# Patient Record
Sex: Male | Born: 1940 | Race: White | Hispanic: No | Marital: Married | Smoking: Never smoker
Health system: Southern US, Community
[De-identification: ages and names within clinical notes are randomized; demographics above are authoritative.]

## PROBLEM LIST (undated history)

## (undated) DIAGNOSIS — E785 Hyperlipidemia, unspecified: Secondary | ICD-10-CM

## (undated) DIAGNOSIS — I10 Essential (primary) hypertension: Secondary | ICD-10-CM

## (undated) DIAGNOSIS — I2510 Atherosclerotic heart disease of native coronary artery without angina pectoris: Secondary | ICD-10-CM

## (undated) DIAGNOSIS — E7800 Pure hypercholesterolemia, unspecified: Secondary | ICD-10-CM

## (undated) DIAGNOSIS — I8290 Acute embolism and thrombosis of unspecified vein: Secondary | ICD-10-CM

## (undated) DIAGNOSIS — K219 Gastro-esophageal reflux disease without esophagitis: Secondary | ICD-10-CM

## (undated) HISTORY — PX: APPENDECTOMY: SHX54

## (undated) HISTORY — PX: KNEE SURGERY: SHX244

## (undated) HISTORY — PX: FACIAL RECONSTRUCTION SURGERY: SHX631

## (undated) HISTORY — DX: Pure hypercholesterolemia, unspecified: E78.00

## (undated) HISTORY — PX: HERNIA REPAIR: SHX51

## (undated) HISTORY — DX: Acute embolism and thrombosis of unspecified vein: I82.90

## (undated) HISTORY — DX: Essential (primary) hypertension: I10

## (undated) HISTORY — PX: CHOLECYSTECTOMY: SHX55

## (undated) HISTORY — DX: Atherosclerotic heart disease of native coronary artery without angina pectoris: I25.10

## (undated) HISTORY — PX: TONSILLECTOMY: SUR1361

---

## 2014-12-04 ENCOUNTER — Emergency Department (HOSPITAL_COMMUNITY): Payer: Medicare Other

## 2014-12-04 ENCOUNTER — Encounter (HOSPITAL_COMMUNITY): Payer: Self-pay | Admitting: Emergency Medicine

## 2014-12-04 ENCOUNTER — Emergency Department (HOSPITAL_COMMUNITY)
Admission: EM | Admit: 2014-12-04 | Discharge: 2014-12-04 | Disposition: A | Discharge status: Home / Self Care | Payer: Medicare Other | Attending: Emergency Medicine | Admitting: Emergency Medicine

## 2014-12-04 DIAGNOSIS — G4489 Other headache syndrome: Secondary | ICD-10-CM | POA: Diagnosis not present

## 2014-12-04 DIAGNOSIS — M5080 Other cervical disc disorders, unspecified cervical region: Secondary | ICD-10-CM | POA: Insufficient documentation

## 2014-12-04 DIAGNOSIS — M47812 Spondylosis without myelopathy or radiculopathy, cervical region: Secondary | ICD-10-CM

## 2014-12-04 DIAGNOSIS — M542 Cervicalgia: Secondary | ICD-10-CM | POA: Diagnosis not present

## 2014-12-04 DIAGNOSIS — R51 Headache: Secondary | ICD-10-CM | POA: Diagnosis present

## 2014-12-04 DIAGNOSIS — Z8719 Personal history of other diseases of the digestive system: Secondary | ICD-10-CM | POA: Insufficient documentation

## 2014-12-04 DIAGNOSIS — Z8639 Personal history of other endocrine, nutritional and metabolic disease: Secondary | ICD-10-CM | POA: Insufficient documentation

## 2014-12-04 HISTORY — DX: Hyperlipidemia, unspecified: E78.5

## 2014-12-04 HISTORY — DX: Gastro-esophageal reflux disease without esophagitis: K21.9

## 2014-12-04 MED ORDER — OXYCODONE-ACETAMINOPHEN 5-325 MG PO TABS
1.0000 | ORAL_TABLET | Freq: Once | ORAL | Status: AC
  Administered 2014-12-04: 1 via ORAL
  Filled 2014-12-04: qty 1

## 2014-12-04 MED ORDER — PREDNISONE 20 MG PO TABS: 20.0000 mg | ORAL_TABLET | Freq: Two times a day (BID) | ORAL | Status: DC

## 2014-12-04 MED ORDER — OXYCODONE-ACETAMINOPHEN 5-325 MG PO TABS: 1.0000 | ORAL_TABLET | ORAL | Status: DC | PRN

## 2014-12-04 NOTE — ED Notes (Signed)
Pt st's feels better after pain med 

## 2014-12-04 NOTE — ED Provider Notes (Signed)
CSN: [id number]     Arrival date & time 12/04/14  1606 History   First MD Initiated Contact with Patient 12/04/14 1632     Chief Complaint  Patient presents with  . Headache     (Consider location/radiation/quality/duration/timing/severity/associated sxs/prior Treatment) Patient is a 74 y.o. male presenting with headaches. The history is provided by the patient.  Headache  Alex Day is a 73 y.o. male here for intermittent, left-sided headache, with left-sided neck pain, and tearing from the left eye.  No radiation of pain to arm, shoulder or back.  No fever, nausea, vomiting, weakness or dizziness.  His been having occasional wheezing over the last month.  This morning he was seen at a clinic and started on albuterol inhaler, Claritin, and low-dose aspirin, for his symptoms.  Apparently there his blood pressure was elevated to "170".  He has never had this before.  No history of high blood pressure, cardiac disease or diabetes. There are no other known modifying factors.  Past Medical History  Diagnosis Date  . Acid reflux   . Hyperlipidemia    Past Surgical History  Procedure Laterality Date  . Appendectomy    . Tonsillectomy    . Knee surgery    . Hernia repair    . Cholecystectomy    . Facial reconstruction surgery     No family history on file. History  Substance Use Topics  . Smoking status: Never Smoker   . Smokeless tobacco: Not on file  . Alcohol Use: No    Review of Systems  Neurological: Positive for headaches.  All other systems reviewed and are negative.     Allergies  Review of patient's allergies indicates no known allergies.  Home Medications   Prior to Admission medications   Not on File   BP 140/89 mmHg  Pulse 89  Temp(Src) 97.9 F (36.6 C) (Oral)  Resp 18  Ht 5\' 7"  (1.702 m)  Wt 185 lb (83.915 kg)  BMI 28.97 kg/m2  SpO2 97% Physical Exam  Constitutional: He is oriented to person, place, and time. He appears well-developed and  well-nourished.  HENT:  Head: Normocephalic and atraumatic.  Right Ear: External ear normal.  Left Ear: External ear normal.  Eyes: Conjunctivae and EOM are normal. Pupils are equal, round, and reactive to light.  Clear drainage, left eye.  Neck: Normal range of motion and phonation normal. Neck supple.  Cardiovascular: Normal rate, regular rhythm and normal heart sounds.   Pulmonary/Chest: Effort normal and breath sounds normal. He exhibits no bony tenderness.  Abdominal: Soft. There is no tenderness.  Musculoskeletal: Normal range of motion.  Decrease motion of the neck, with  lateral bending and lateral rotation secondary to pain.  Neurological: He is alert and oriented to person, place, and time. No cranial nerve deficit or sensory deficit. He exhibits normal muscle tone. Coordination normal.  Skin: Skin is warm, dry and intact.  Psychiatric: He has a normal mood and affect. His behavior is normal. Judgment and thought content normal.  Nursing note and vitals reviewed.   ED Course  Procedures (including critical care time)  Medications  oxyCODONE-acetaminophen (PERCOCET/ROXICET) 5-325 MG per tablet 1 tablet (not administered)    Patient Vitals for the past 24 hrs:  BP Temp Temp src Pulse Resp SpO2 Height Weight  12/04/14 1617 140/89 mmHg 97.9 F (36.6 C) Oral 89 18 97 % 5\' 7"  (1.702 m) 185 lb (83.915 kg)    6:06 PM Reevaluation with update and discussion. After initial  assessment and treatment, an updated evaluation reveals pain improved after Percocet.  No tearing from the left eye.  Findings discussed with patient and wife, all questions answered. [place] Review Labs Reviewed - No data to display  Imaging Review No results found.   EKG Interpretation None      MDM   Final diagnoses:  Other headache syndrome  Neck pain  Degenerative joint disease of cervical spine    Neck pain and headache likely secondary to degenerative joint disease of the  left upper neck.  Doubt CVA, spinal myelopathy or serious bacterial infection.  Nursing Notes Reviewed/ Care Coordinated Applicable Imaging Reviewed Interpretation of Laboratory Data incorporated into ED treatment  The patient appears reasonably screened and/or stabilized for discharge and I doubt any other medical condition or other [facility] requiring further screening, evaluation, or treatment in the ED at this time prior to discharge.  Plan: Home Medications- Prednisone and Percocet; Home Treatments- heat ; return here if the recommended treatment, does not improve the symptoms; Recommended follow up- PCP 1 week   Richarda Blade, MD 12/04/14 3893

## 2014-12-04 NOTE — ED Notes (Signed)
PT C/O headache without nausea or vomiting.  Pt alert and oriented x's 3. Neuro exam neg.  Family at bedside.

## 2014-12-04 NOTE — ED Notes (Signed)
Pt seen at [facility] this morning for intermittent headaches over past 2 days. He was told is headache got worse to come to ER. Pt also reports L sided neck stiffness, denies fevers/chills. Pt sts that his bp is elevated.

## 2014-12-04 NOTE — Discharge Instructions (Signed)
General Headache Without Cause A headache is pain or discomfort felt around the head or neck area. The specific cause of a headache may not be found. There are many causes and types of headaches. A few common ones are:  Tension headaches.  Migraine headaches.  Cluster headaches.  Chronic daily headaches. HOME CARE INSTRUCTIONS   Keep all follow-up appointments with your caregiver or any specialist referral.  Only take over-the-counter or prescription medicines for pain or discomfort as directed by your caregiver.  Lie down in a dark, quiet room when you have a headache.  Keep a headache journal to find out what may trigger your migraine headaches. For example, write down:  What you eat and drink.  How much sleep you get.  Any change to your diet or medicines.  Try massage or other relaxation techniques.  Put ice packs or heat on the head and neck. Use these 3 to 4 times per day for 15 to 20 minutes each time, or as needed.  Limit stress.  Sit up straight, and do not tense your muscles.  Quit smoking if you smoke.  Limit alcohol use.  Decrease the amount of caffeine you drink, or stop drinking caffeine.  Eat and sleep on a regular schedule.  Get 7 to 9 hours of sleep, or as recommended by your caregiver.  Keep lights dim if bright lights bother you and make your headaches worse. SEEK MEDICAL CARE IF:   You have problems with the medicines you were prescribed.  Your medicines are not working.  You have a change from the usual headache.  You have nausea or vomiting. SEEK IMMEDIATE MEDICAL CARE IF:   Your headache becomes severe.  You have a fever.  You have a stiff neck.  You have loss of vision.  You have muscular weakness or loss of muscle control.  You start losing your balance or have trouble walking.  You feel faint or pass out.  You have severe symptoms that are different from your first symptoms. MAKE SURE YOU:   Understand these  instructions.  Will watch your condition.  Will get help right away if you are not doing well or get worse. Document Released: 10/11/2005 Document Revised: 01/03/2012 Document Reviewed: 10/27/2011 [facility] Patient Information 2015 [place], [place]. This information is not intended to replace advice given to you by your health care provider. Make sure you discuss any questions you have with your health care provider. Osteoarthritis Osteoarthritis is a disease that causes soreness and inflammation of a joint. It occurs when the cartilage at the affected joint wears down. Cartilage acts as a cushion, covering the ends of bones where they meet to form a joint. Osteoarthritis is the most common form of arthritis. It often occurs in older people. The joints affected most often by this condition include those in the:  Ends of the fingers.  Thumbs.  Neck.  Lower back.  Knees.  Hips. CAUSES  Over time, the cartilage that covers the ends of bones begins to wear away. This causes bone to rub on bone, producing pain and stiffness in the affected joints.  RISK FACTORS Certain factors can increase your chances of having osteoarthritis, including:  Older age.  Excessive body weight.  Overuse of joints.  Previous joint injury. SIGNS AND SYMPTOMS   Pain, swelling, and stiffness in the joint.  Over time, the joint may lose its normal shape.  Small deposits of bone (osteophytes) may grow on the edges of the joint.  Bits of  bone or cartilage can break off and float inside the joint space. This may cause more pain and damage. DIAGNOSIS  Your health care provider will do a physical exam and ask about your symptoms. Various tests may be ordered, such as:  X-rays of the affected joint.  An MRI scan.  Blood tests to rule out other types of arthritis.  Joint fluid tests. This involves using a needle to draw fluid from the joint and examining the fluid under a microscope. TREATMENT  Goals of  treatment are to control pain and improve joint function. Treatment plans may include:  A prescribed exercise program that allows for rest and joint relief.  A weight control plan.  Pain relief techniques, such as:  Properly applied heat and cold.  Electric pulses delivered to nerve endings under the skin (transcutaneous electrical nerve stimulation [TENS]).  Massage.  Certain nutritional supplements.  Medicines to control pain, such as:  Acetaminophen.  Nonsteroidal anti-inflammatory drugs (NSAIDs), such as naproxen.  Narcotic or central-acting agents, such as tramadol.  Corticosteroids. These can be given orally or as an injection.  Surgery to reposition the bones and relieve pain (osteotomy) or to remove loose pieces of bone and cartilage. Joint replacement may be needed in advanced states of osteoarthritis. HOME CARE INSTRUCTIONS   Take medicines only as directed by your health care provider.  Maintain a healthy weight. Follow your health care provider's instructions for weight control. This may include dietary instructions.  Exercise as directed. Your health care provider can recommend specific types of exercise. These may include:  Strengthening exercises. These are done to strengthen the muscles that support joints affected by arthritis. They can be performed with weights or with exercise bands to add resistance.  Aerobic activities. These are exercises, such as brisk walking or low-impact aerobics, that get your heart pumping.  Range-of-motion activities. These keep your joints limber.  Balance and agility exercises. These help you maintain daily living skills.  Rest your affected joints as directed by your health care provider.  Keep all follow-up visits as directed by your health care provider. SEEK MEDICAL CARE IF:   Your skin turns red.  You develop a rash in addition to your joint pain.  You have worsening joint pain.  You have a fever along with  joint or muscle aches. SEEK IMMEDIATE MEDICAL CARE IF:  You have a significant loss of weight or appetite.  You have night sweats. [place] of Arthritis and Musculoskeletal and Skin Diseases: www.niams.SouthExposed.es  [employer] on Aging: http://kim-miller.com/  American College of Rheumatology: www.rheumatology.org Document Released: 10/11/2005 Document Revised: 02/25/2014 Document Reviewed: 06/18/2013 [facility] Patient Information 2015 [place], [place]. This information is not intended to replace advice given to you by your health care provider. Make sure you discuss any questions you have with your health care provider.

## 2015-08-30 IMAGING — CT CT CERVICAL SPINE W/O CM
3 of 5 series · 10 of 27 positions shown, 12 images · non-contrast
Comparison: None.

CLINICAL DATA: 74-year-old male with intermittent headaches and
left-sided neck stiffness over the past 2 days

EXAM:
CT HEAD WITHOUT CONTRAST
CT CERVICAL SPINE WITHOUT CONTRAST
TECHNIQUE: Multidetector CT imaging of the head and cervical spine was
performed following the standard protocol without intravenous
contrast. Multiplanar CT image reconstructions of the cervical spine
were also generated.

[Series 5: c_spine 2.0 i30s 3 · axial · 0.21mm/px · z∈[-225,-175]mm · 2 of 77 slices shown]
[im 26/77  bone]
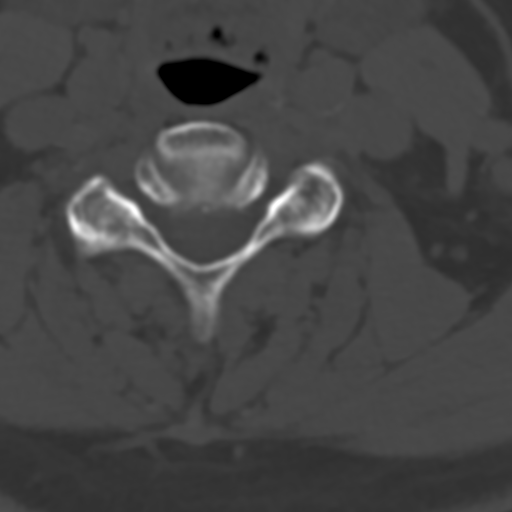
[im 51/77  bone]
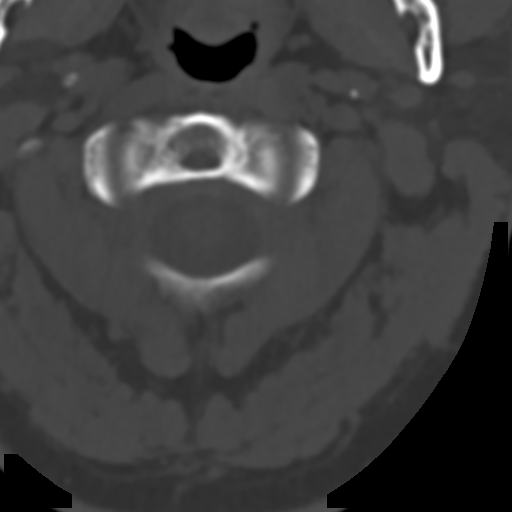

[Series 8: sagittals · sagittal · 0.14mm/px · 5 of 35 slices shown, 6 images]
[im 12/35  bone]
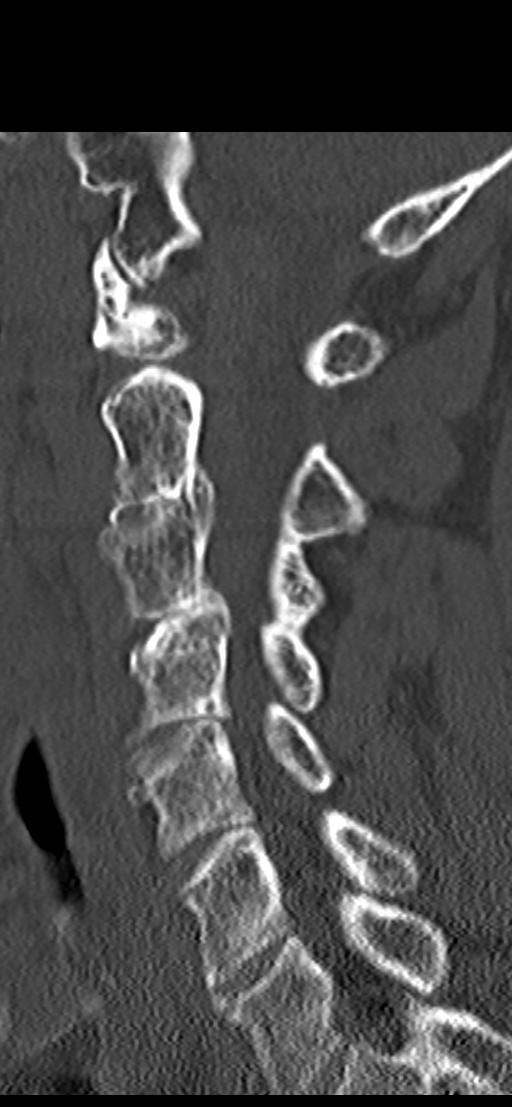
[im 15/35  bone]
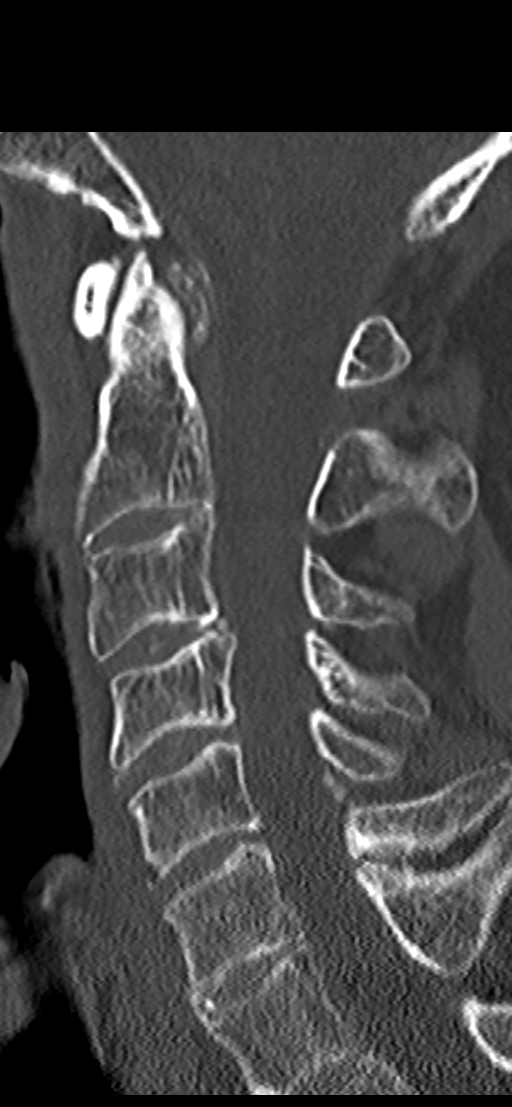
[im 18/35  soft-tissue]
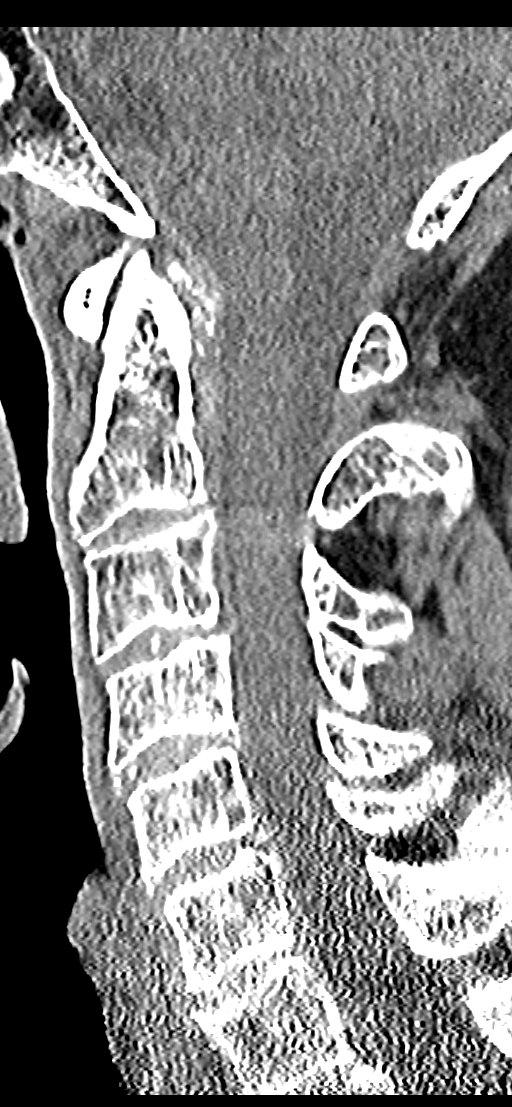
[im 18/35  bone]
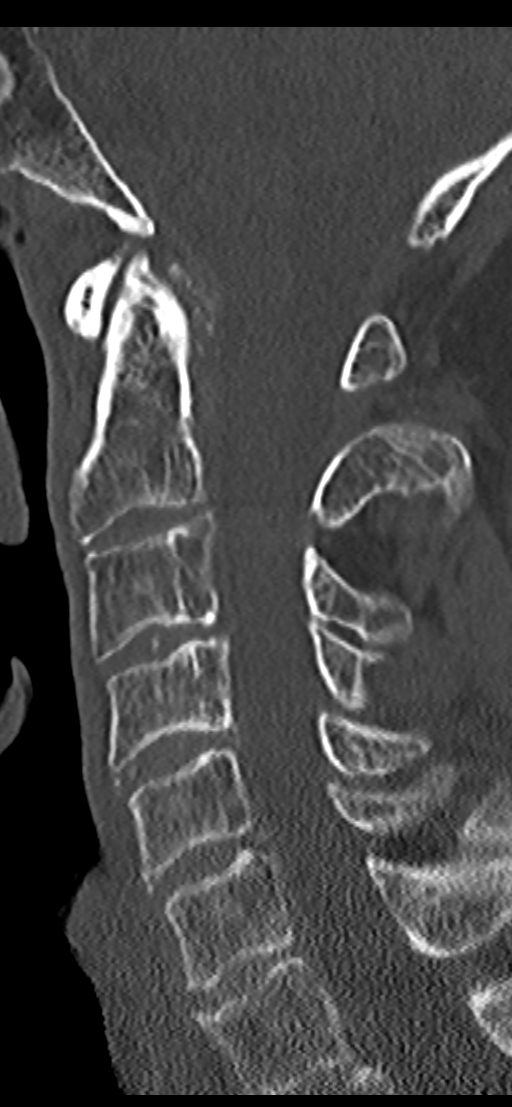
[im 20/35  bone]
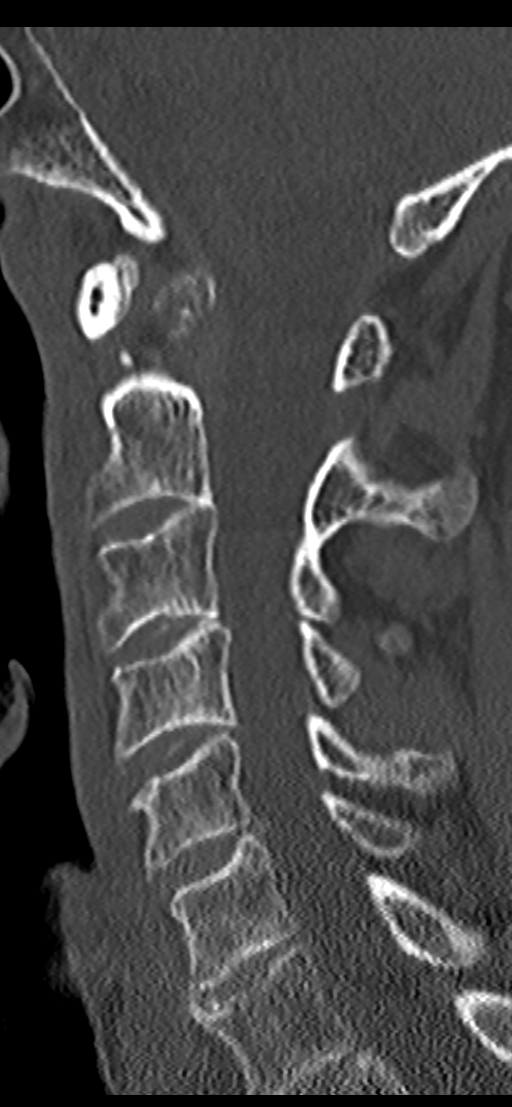
[im 23/35  bone]
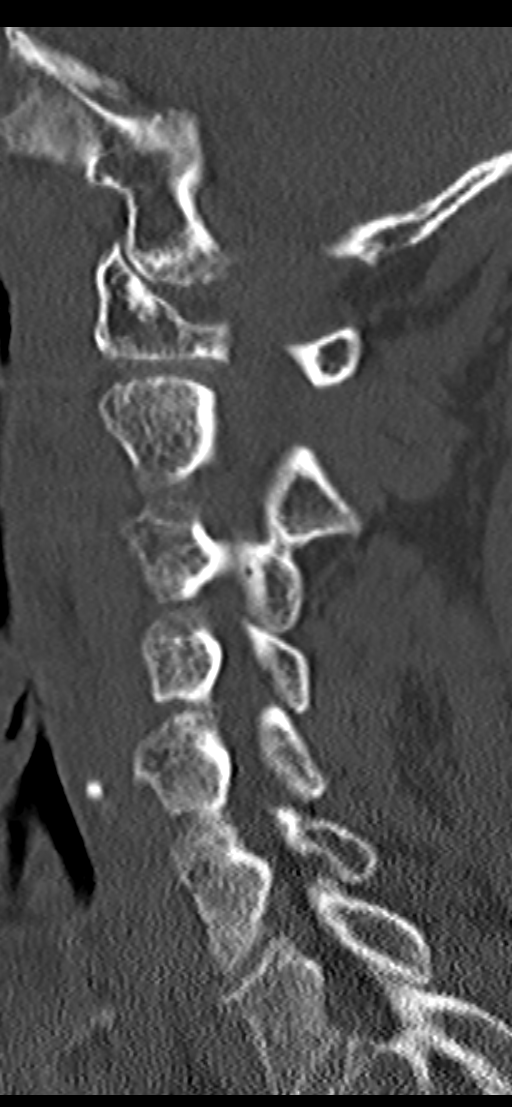

[Series 10: orthogonals · axial · 0.21mm/px · z∈[-260,-183]mm · 3 of 84 slices shown, 4 images]
[im 21/84  soft-tissue]
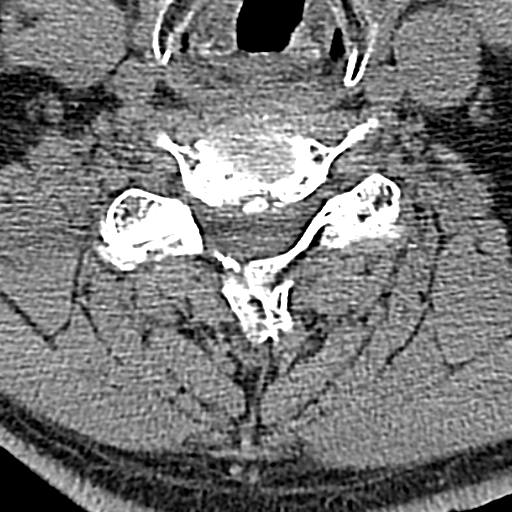
[im 21/84  bone]
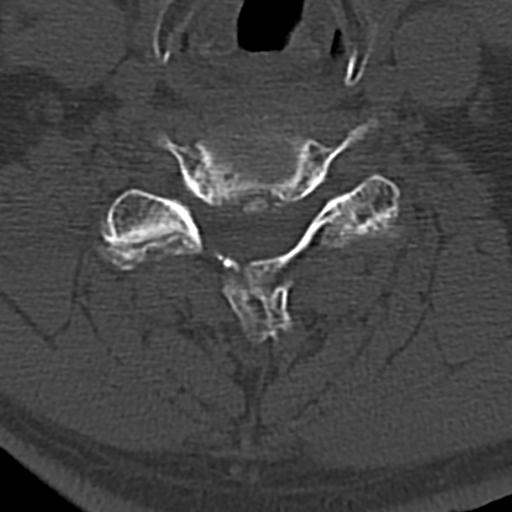
[im 42/84  bone]
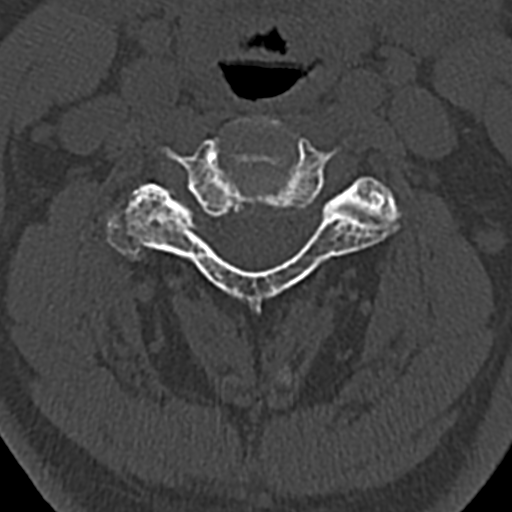
[im 63/84  bone]
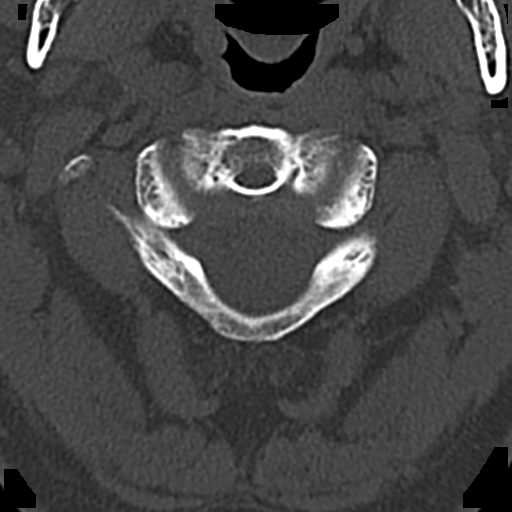

[10 of 27 positions shown; findings below may reference images not displayed]

FINDINGS: CT HEAD FINDINGS

Negative for acute intracranial hemorrhage, acute infarction, mass,
mass effect, hydrocephalus or midline shift. Gray-white
differentiation is preserved throughout. No focal soft tissue or
calvarial abnormality. Visualized globes and orbits are unremarkable
bilaterally. Surgical changes of prior ORIF of a left anterior
maxillary wall fracture. Normal aeration of the mastoid air cells.
Mucoperiosteal thickening of the right sphenoid air cell.

CT CERVICAL SPINE FINDINGS

No acute fracture, malalignment or prevertebral soft tissue
swelling. Degenerative changes about the atlantodental interval.
Ankylosis of the posterior elements of C2 and C3 on the left.
Ankylosis of posterior elements of C3 and C4 on the right.
Otherwise, relatively mild spondylitic change and degenerative disc
disease. Mild osteopenia. No lytic or blastic osseous lesion.
Incomplete fusion of the posterior elements at C6. Unremarkable CT
appearance of the thyroid gland. No acute soft tissue abnormality.
The lung apices are unremarkable.
IMPRESSION: CT HEAD

1. No acute intracranial abnormality.
2. Inflammatory paranasal sinus disease involving the right sphenoid
air cell.
3. Prior ORIF of a now healed fracture involving the left anterior
maxillary wall.
CT CSPINE

1. No acute fracture or malalignment.
2. Facet arthropathy with ankylosis of the posterior elements of C2
and C3 on the left and C3 and C4 on the right.
3. Incomplete fusion of the posterior elements at C6.

## 2017-12-14 DIAGNOSIS — I83812 Varicose veins of left lower extremities with pain: Secondary | ICD-10-CM | POA: Insufficient documentation

## 2018-01-25 DIAGNOSIS — M25472 Effusion, left ankle: Secondary | ICD-10-CM | POA: Insufficient documentation

## 2019-10-15 DIAGNOSIS — C4492 Squamous cell carcinoma of skin, unspecified: Secondary | ICD-10-CM

## 2019-10-15 HISTORY — DX: Squamous cell carcinoma of skin, unspecified: C44.92

## 2019-11-13 ENCOUNTER — Other Ambulatory Visit: Payer: Self-pay

## 2019-11-13 ENCOUNTER — Telehealth: Payer: Self-pay | Admitting: Internal Medicine

## 2019-11-13 ENCOUNTER — Encounter: Payer: Self-pay | Admitting: Internal Medicine

## 2019-11-13 ENCOUNTER — Ambulatory Visit (INDEPENDENT_AMBULATORY_CARE_PROVIDER_SITE_OTHER): Payer: Medicare Other | Admitting: Internal Medicine

## 2019-11-13 VITALS — BP 112/72 | HR 66 | Temp 98.3°F | Ht 67.0 in | Wt 193.0 lb

## 2019-11-13 DIAGNOSIS — I2699 Other pulmonary embolism without acute cor pulmonale: Secondary | ICD-10-CM | POA: Diagnosis not present

## 2019-11-13 NOTE — Progress Notes (Signed)
Synopsis: Pre op evaluation  Subjective:   PATIENT ID: Alex Day GENDER: male DOB: [birth year], MRN: [id number]  Chief Complaint  Patient presents with  . Consult    Surgical clearance eye surgery     HPI Here for pre-op clearance for vitrectomy and membrane peel with general anesthesia.  Has had multiple surgeries in past without issue with anesthesia or breathing.  Only new issue is a pulmonary embolism, probably unprovoked in December 2019 although he does have a lot of anatomic vein problems in LLE per chart review from [facility] (Dr. Signa Kell).  He has been on eliquis indefinitely since, he is not sure why.  MMRC 0 dyspnea.  Neg ROS below.  No issues with bleeding with eliquis.  Question for pulmonary is can he take a break from eliquis for the surgery.  ROS Positive Symptoms in bold:  Constitutional fevers, chills, weight loss, fatigue, anorexia, malaise  Eyes decreased vision, double vision, eye irritation  Ears, Nose, Mouth, Throat sore throat, trouble swallowing, sinus congestion  Cardiovascular chest pain, paroxysmal nocturnal dyspnea, lower ext edema, palpitations   Respiratory SOB, cough, DOE, hemoptysis, wheezing  Gastrointestinal nausea, vomiting, diarrhea  Genitourinary burning with urination, trouble urinating  Musculoskeletal joint aches, joint swelling, back pain  Integumentary  rashes, skin lesions  Neurological focal weakness, focal numbness, trouble speaking, headaches  Psychiatric depression, anxiety, confusion  Endocrine polyuria, polydipsia, cold intolerance, heat intolerance  Hematologic abnormal bruising, abnormal bleeding, unexplained nose bleeds  Allergic/Immunologic recurrent infections, hives, swollen lymph nodes    Past Medical History:  Diagnosis Date  . Acid reflux   . Hyperlipidemia      No family history on file.   Past Surgical History:  Procedure Laterality Date  . APPENDECTOMY    . CHOLECYSTECTOMY     . FACIAL RECONSTRUCTION SURGERY    . HERNIA REPAIR    . KNEE SURGERY    . TONSILLECTOMY      Social History   Socioeconomic History  . Marital status: Married    Spouse name: Not on file  . Number of children: Not on file  . Years of education: Not on file  . Highest education level: Not on file  Occupational History  . Not on file  Tobacco Use  . Smoking status: Never Smoker  . Smokeless tobacco: Never Used  Substance and Sexual Activity  . Alcohol use: No  . Drug use: No  . Sexual activity: Not Currently  Other Topics Concern  . Not on file  Social History Narrative  . Not on file   Social Determinants of Health   Financial Resource Strain:   . Difficulty of Paying Living Expenses: Not on file  Food Insecurity:   . Worried About Charity fundraiser in the Last Year: Not on file  . Ran Out of Food in the Last Year: Not on file  Transportation Needs:   . Lack of Transportation (Medical): Not on file  . Lack of Transportation (Non-Medical): Not on file  Physical Activity:   . Days of Exercise per Week: Not on file  . Minutes of Exercise per Session: Not on file  Stress:   . Feeling of Stress : Not on file  Social Connections:   . Frequency of Communication with Friends and Family: Not on file  . Frequency of Social Gatherings with Friends and Family: Not on file  . Attends Religious Services: Not on file  . Active Member of Clubs or Organizations: Not on file  .  Attends Archivist Meetings: Not on file  . Marital Status: Not on file  Intimate Partner Violence:   . Fear of Current or Ex-Partner: Not on file  . Emotionally Abused: Not on file  . Physically Abused: Not on file  . Sexually Abused: Not on file     No Known Allergies   Outpatient Medications Prior to Visit  Medication Sig Dispense Refill  . apixaban (ELIQUIS) 5 MG TABS tablet Take by mouth.    Marland Kitchen atorvastatin (LIPITOR) 40 MG tablet     . metoprolol succinate (TOPROL-XL) 50 MG 24 hr  tablet Take by mouth.    . Multiple Vitamin (MULTIVITAMIN) capsule Take by mouth.    Marland Kitchen omeprazole (PRILOSEC) 20 MG capsule Take by mouth.    . oxyCODONE-acetaminophen (PERCOCET) 5-325 MG per tablet Take 1 tablet by mouth every 4 (four) hours as needed for severe pain. 20 tablet 0  . predniSONE (DELTASONE) 20 MG tablet Take 1 tablet (20 mg total) by mouth 2 (two) times daily. 10 tablet 0  . albuterol (VENTOLIN HFA) 108 (90 Base) MCG/ACT inhaler      No facility-administered medications prior to visit.     Objective:  GEN: elderly man in NAD HEENT: MMM, no thrush, malampatti 1 CV: RRR, ext warm PULM: Clear, no accessory muscle use GI: Soft, +BS EXT: Maybe trace edema on left, arthritic changes in hands NEURO: moves all 4 ext to command PSYCH: AOx3, fair insight SKIN: No rashes, age-related changes  Vitals:   11/13/19 1455  BP: 112/72  Pulse: 66  Temp: 98.3 F (36.8 C)  TempSrc: Temporal  SpO2: 95%  Weight: 193 lb (87.5 kg)  Height: 5\' 7"  (1.702 m)   95% on RA BMI Readings from Last 3 Encounters:  11/13/19 30.23 kg/m  12/04/14 28.98 kg/m   Wt Readings from Last 3 Encounters:  11/13/19 193 lb (87.5 kg)  12/04/14 185 lb (83.9 kg)      Assessment & Plan:   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    Has had no issues since and I see no reason why he cannot go off eliquis for a week or so.  No further pulmonary testing needs to be done prior to planned surgery.  Will route to eye surgeon.  Please call if any questions, patient can f/u PRN.    Current Outpatient Medications:  .  apixaban (ELIQUIS) 5 MG TABS tablet, Take by mouth., Disp: , Rfl:  .  atorvastatin (LIPITOR) 40 MG tablet, , Disp: , Rfl:  .  metoprolol succinate (TOPROL-XL) 50 MG 24 hr tablet, Take by mouth., Disp: , Rfl:  .  Multiple Vitamin (MULTIVITAMIN) capsule, Take by mouth., Disp: , Rfl:  .  omeprazole (PRILOSEC) 20 MG capsule, Take by mouth., Disp: ,  Rfl:  .  oxyCODONE-acetaminophen (PERCOCET) 5-325 MG per tablet, Take 1 tablet by mouth every 4 (four) hours as needed for severe pain., Disp: 20 tablet, Rfl: 0 .  predniSONE (DELTASONE) 20 MG tablet, Take 1 tablet (20 mg total) by mouth 2 (two) times daily., Disp: 10 tablet, Rfl: 0 .  albuterol (VENTOLIN HFA) 108 (90 Base) MCG/ACT inhaler, , Disp: , Rfl:    Candee Furbish, MD [facility] 11/13/2019 4:27 PM

## 2019-11-13 NOTE — Telephone Encounter (Signed)
Office closed call 1/20

## 2019-11-13 NOTE — Patient Instructions (Signed)
-   You are okay to stop eliquis for your surgery - Ask your PCP how long they think you should remain on eliquis - No further pulmonary testing needs to be done prior to planned procedure

## 2019-11-15 NOTE — Telephone Encounter (Signed)
Spoke with Anderson [place] and she states she received the surgical clearance and pt is in surgery today. Nothing further is needed.

## 2020-10-07 ENCOUNTER — Encounter: Payer: Self-pay | Admitting: Cardiovascular Disease

## 2020-10-07 ENCOUNTER — Ambulatory Visit (INDEPENDENT_AMBULATORY_CARE_PROVIDER_SITE_OTHER): Payer: Medicare Other | Admitting: Cardiovascular Disease

## 2020-10-07 ENCOUNTER — Other Ambulatory Visit: Payer: Self-pay

## 2020-10-07 DIAGNOSIS — E785 Hyperlipidemia, unspecified: Secondary | ICD-10-CM | POA: Insufficient documentation

## 2020-10-07 DIAGNOSIS — I480 Paroxysmal atrial fibrillation: Secondary | ICD-10-CM

## 2020-10-07 DIAGNOSIS — E782 Mixed hyperlipidemia: Secondary | ICD-10-CM | POA: Diagnosis not present

## 2020-10-07 DIAGNOSIS — Z86711 Personal history of pulmonary embolism: Secondary | ICD-10-CM | POA: Insufficient documentation

## 2020-10-07 NOTE — Patient Instructions (Signed)

## 2020-10-07 NOTE — Assessment & Plan Note (Signed)
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 

## 2020-10-07 NOTE — Assessment & Plan Note (Signed)
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

## 2020-10-07 NOTE — Assessment & Plan Note (Signed)
History of hyperlipidemia on statin therapy recently checked by his cardiologist in [place].

## 2020-10-07 NOTE — Progress Notes (Signed)
10/07/2020 Alex Day   1940-12-20  [id number]  Primary Physician Patient, No Pcp Per Primary Cardiologist: Lorretta Harp MD Renae Gloss  HPI:  Alex Day is a 79 y.o. mildly overweight married Caucasian male father of 1 daughter, grandfather of 64 grandson recently relocated from [place] to [place] to be closer to his family.  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  He currently does not have a primary care physician.  He was taken care of by a cardiologist, Dr. Posey Pronto in the [place] who last saw him in the office 08/07/2020 [facility]).  He has never smoked and does not drink alcohol.  There is no family history of heart disease.  He is never had a heart attack or stroke.  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  There is also history of PAF.  He denies chest pain or shortness of breath.  He does not exercise but "putters around the house".  He had carotid and lower extremity Dopplers performed 2018 which were normal and a 2D echo that revealed normal LV systolic function without valvular abnormalities.   Current Meds  Medication Sig  . apixaban (ELIQUIS) 5 MG TABS tablet Take by mouth.  Marland Kitchen atorvastatin (LIPITOR) 40 MG tablet   . metoprolol succinate (TOPROL-XL) 50 MG 24 hr tablet Take by mouth.  . Multiple Vitamin (MULTIVITAMIN) capsule Take by mouth.  Marland Kitchen omeprazole (PRILOSEC) 20 MG capsule Take by mouth.     No Known Allergies  Social History   Socioeconomic History  . Marital status: Married    Spouse name: Not on file  . Number of children: Not on file  . Years of education: Not on file  . Highest education level: Not on file  Occupational History  . Not on file  Tobacco Use  . Smoking status: Never Smoker  . Smokeless tobacco: Never Used  Substance and Sexual Activity  . Alcohol use: No  . Drug use: No  .  Sexual activity: Not Currently  Other Topics Concern  . Not on file  Social History Narrative  . Not on file   Social Determinants of Health   Financial Resource Strain: Not on file  Food Insecurity: Not on file  Transportation Needs: Not on file  Physical Activity: Not on file  Stress: Not on file  Social Connections: Not on file  Intimate Partner Violence: Not on file     Review of Systems: General: negative for chills, fever, night sweats or weight changes.  Cardiovascular: negative for chest pain, dyspnea on exertion, edema, orthopnea, palpitations, paroxysmal nocturnal dyspnea or shortness of breath Dermatological: negative for rash Respiratory: negative for cough or wheezing Urologic: negative for hematuria Abdominal: negative for nausea, vomiting, diarrhea, bright red blood per rectum, melena, or hematemesis Neurologic: negative for visual changes, syncope, or dizziness All other systems reviewed and are otherwise negative except as noted above.    Blood pressure 112/78, pulse (!) 59, resp. rate 14, height 5\' 7"  (1.702 m), weight 188 lb 3.2 oz (85.4 kg), SpO2 96 %.  General appearance: alert and no distress Neck: no adenopathy, no carotid bruit, no JVD, supple, symmetrical, trachea midline and thyroid not enlarged, symmetric, no tenderness/mass/nodules Lungs: clear to auscultation bilaterally Heart: regular rate and rhythm, S1, S2 normal, no murmur, click, rub or gallop Extremities: extremities  normal, atraumatic, no cyanosis or edema Pulses: 2+ and symmetric Skin: Skin color, texture, turgor normal. No rashes or lesions Neurologic: Alert and oriented X 3, normal strength and tone. Normal symmetric reflexes. Normal coordination and gait  EKG sinus bradycardia 58 with right bundle branch block.  I personally reviewed this EKG.  ASSESSMENT AND PLAN:   Hyperlipidemia History of hyperlipidemia on statin therapy recently checked by his cardiologist in [place].  History of pulmonary embolism History of remote DVT and subsequent pulmonary embolism 2 to 3 years ago on Eliquis oral anticoagulation.  PAF (paroxysmal atrial fibrillation) (HCC) History of PAF on Eliquis oral anticoagulation.      Lorretta Harp MD FACP,FACC,FAHA, [facility] 10/07/2020 10:05 AM

## 2021-02-17 ENCOUNTER — Other Ambulatory Visit: Payer: Self-pay

## 2021-02-17 ENCOUNTER — Encounter: Payer: Self-pay | Admitting: Dermatology

## 2021-02-17 ENCOUNTER — Ambulatory Visit (INDEPENDENT_AMBULATORY_CARE_PROVIDER_SITE_OTHER): Payer: Medicare Other | Admitting: Dermatology

## 2021-02-17 DIAGNOSIS — Z1283 Encounter for screening for malignant neoplasm of skin: Secondary | ICD-10-CM | POA: Diagnosis not present

## 2021-02-17 DIAGNOSIS — L821 Other seborrheic keratosis: Secondary | ICD-10-CM | POA: Diagnosis not present

## 2021-02-17 DIAGNOSIS — L729 Follicular cyst of the skin and subcutaneous tissue, unspecified: Secondary | ICD-10-CM | POA: Diagnosis not present

## 2021-02-17 DIAGNOSIS — L570 Actinic keratosis: Secondary | ICD-10-CM | POA: Diagnosis not present

## 2021-02-17 DIAGNOSIS — D485 Neoplasm of uncertain behavior of skin: Secondary | ICD-10-CM | POA: Diagnosis not present

## 2021-02-17 NOTE — Patient Instructions (Signed)

## 2021-02-27 ENCOUNTER — Encounter: Payer: Self-pay | Admitting: Dermatology

## 2021-02-27 NOTE — Progress Notes (Signed)
   Follow-Up Visit   Subjective  Alex Day is a 80 y.o. male who presents for the following: Annual Exam (No new concerns).  New lesion right cheek, check several other spots Location:  Duration:  Quality:  Associated Signs/Symptoms: Modifying Factors:  Severity:  Timing: Context:   Objective  Well appearing patient in no apparent distress; mood and affect are within normal limits. Objective  Left Forehead: Tan flattopped textured 5 mm slightly raised papule; dermoscopy typical  Objective  Right Anterior Mandible: Pearly 5 mm pink papule with focal erosion     Objective  Dorsum of Nose, Left Zygomatic Area, Mid Forehead: Pink 3 to 6 mm gritty crusts    All skin waist up examined.   Assessment & Plan    Cyst of skin Mid Back  Seborrheic keratosis Left Forehead  Leave if stable  Neoplasm of uncertain behavior of skin Right Anterior Mandible  Skin / nail biopsy Type of biopsy: tangential   Informed consent: discussed and consent obtained   Timeout: patient name, date of birth, surgical site, and procedure verified   Procedure prep:  Patient was prepped and draped in usual sterile fashion (Non sterile) Prep type:  Chlorhexidine Anesthesia: the lesion was anesthetized in a standard fashion   Anesthetic:  1% lidocaine w/ epinephrine 1-100,000 local infiltration Instrument used: flexible razor blade   Hemostasis achieved with: ferric subsulfate   Outcome: patient tolerated procedure well   Post-procedure details: wound care instructions given    Specimen 1 - Surgical pathology Differential Diagnosis: R/O BCC vs SCC  Check Margins: No  AK (actinic keratosis) (3) Mid Forehead; Dorsum of Nose; Left Zygomatic Area  Destruction of lesion - Dorsum of Nose, Left Zygomatic Area, Mid Forehead Complexity: simple   Destruction method: cryotherapy   Informed consent: discussed and consent obtained   Timeout:  patient name, date of birth, surgical site, and  procedure verified Lesion destroyed using liquid nitrogen: Yes   Cryotherapy cycles:  3 Outcome: patient tolerated procedure well with no complications   Post-procedure details: wound care instructions given        I, Lavonna Monarch, MD, have reviewed all documentation for this visit.  The documentation on 02/27/21 for the exam, diagnosis, procedures, and orders are all accurate and complete.

## 2021-03-02 ENCOUNTER — Telehealth: Payer: Self-pay

## 2021-03-02 NOTE — Telephone Encounter (Signed)
-----   Message from Lavonna Monarch, MD sent at 02/24/2021  7:38 PM EDT ----- Although I was pleased that the biopsy did not show skin cancer, this may be the top of an underlying inflamed cyst.  If there is persistent inflammation or drainage I would have him return at which time we may inject the spot.

## 2021-03-02 NOTE — Telephone Encounter (Signed)
Phone call to patient with his pathology results. Patient aware.  

## 2021-04-09 ENCOUNTER — Encounter: Payer: Self-pay | Admitting: Podiatry

## 2021-04-09 ENCOUNTER — Ambulatory Visit (INDEPENDENT_AMBULATORY_CARE_PROVIDER_SITE_OTHER): Payer: Medicare Other | Admitting: Podiatry

## 2021-04-09 ENCOUNTER — Other Ambulatory Visit: Payer: Self-pay

## 2021-04-09 DIAGNOSIS — R202 Paresthesia of skin: Secondary | ICD-10-CM

## 2021-04-09 DIAGNOSIS — L600 Ingrowing nail: Secondary | ICD-10-CM

## 2021-04-09 DIAGNOSIS — Z87442 Personal history of urinary calculi: Secondary | ICD-10-CM | POA: Insufficient documentation

## 2021-04-09 DIAGNOSIS — R972 Elevated prostate specific antigen [PSA]: Secondary | ICD-10-CM | POA: Insufficient documentation

## 2021-04-09 DIAGNOSIS — N401 Enlarged prostate with lower urinary tract symptoms: Secondary | ICD-10-CM | POA: Insufficient documentation

## 2021-04-09 DIAGNOSIS — N529 Male erectile dysfunction, unspecified: Secondary | ICD-10-CM | POA: Insufficient documentation

## 2021-04-09 DIAGNOSIS — D6869 Other thrombophilia: Secondary | ICD-10-CM | POA: Insufficient documentation

## 2021-04-09 DIAGNOSIS — I700 Atherosclerosis of aorta: Secondary | ICD-10-CM | POA: Insufficient documentation

## 2021-04-09 DIAGNOSIS — M069 Rheumatoid arthritis, unspecified: Secondary | ICD-10-CM | POA: Insufficient documentation

## 2021-04-09 DIAGNOSIS — K219 Gastro-esophageal reflux disease without esophagitis: Secondary | ICD-10-CM | POA: Insufficient documentation

## 2021-04-09 DIAGNOSIS — N209 Urinary calculus, unspecified: Secondary | ICD-10-CM | POA: Insufficient documentation

## 2021-04-09 DIAGNOSIS — I10 Essential (primary) hypertension: Secondary | ICD-10-CM | POA: Insufficient documentation

## 2021-04-09 MED ORDER — NEOMYCIN-POLYMYXIN-HC 1 % OT SOLN: OTIC | 1 refills | Status: AC

## 2021-04-09 NOTE — Patient Instructions (Signed)

## 2021-04-11 NOTE — Progress Notes (Signed)
Subjective:  Patient ID: Alex Day, male    DOB: 1941/08/11,  MRN: [id number] HPI Chief Complaint  Patient presents with   Toe Pain    Hallux left - lateral border, ingrown x months, wife usually trims it out, but was too sensitive, better today though   New Patient (Initial Visit)    80 y.o. male presents with the above complaint.   ROS: Denies fever chills nausea vomiting muscle aches pains calf pain back pain chest pain shortness of breath.  Does relate hypersensitivity tingling in the legs and feet.  Past Medical History:  Diagnosis Date   Acid reflux    Hyperlipidemia    Past Surgical History:  Procedure Laterality Date   APPENDECTOMY     CHOLECYSTECTOMY     FACIAL RECONSTRUCTION SURGERY     HERNIA REPAIR     KNEE SURGERY     TONSILLECTOMY      Current Outpatient Medications:    NEOMYCIN-POLYMYXIN-HYDROCORTISONE (CORTISPORIN) 1 % SOLN OTIC solution, Apply 1-2 drops to toe BID after soaking, Disp: 10 mL, Rfl: 1   apixaban (ELIQUIS) 5 MG TABS tablet, Take by mouth., Disp: , Rfl:    atorvastatin (LIPITOR) 40 MG tablet, , Disp: , Rfl:    calcium-vitamin D (OSCAL WITH D) 500-200 MG-UNIT tablet, Take 1 tablet by mouth., Disp: , Rfl:    cycloSPORINE (RESTASIS) 0.05 % ophthalmic emulsion, 1 drop 2 (two) times daily., Disp: , Rfl:    folic acid (FOLVITE) 1 MG tablet, Take 1 mg by mouth daily., Disp: , Rfl:    magnesium 30 MG tablet, Take 30 mg by mouth 2 (two) times daily., Disp: , Rfl:    methotrexate (RHEUMATREX) 2.5 MG tablet, Take 2.5 mg by mouth once a week. Caution:Chemotherapy. Protect from light. Take 6 po q week, Disp: , Rfl:    metoprolol succinate (TOPROL-XL) 50 MG 24 hr tablet, Take by mouth., Disp: , Rfl:    Multiple Vitamin (MULTIVITAMIN) capsule, Take by mouth., Disp: , Rfl:    Omega-3 Fatty Acids (FISH OIL) 1000 MG CAPS, Take by mouth., Disp: , Rfl:    omeprazole (PRILOSEC) 20 MG capsule, Take by mouth., Disp: , Rfl:    vitamin B-12 (CYANOCOBALAMIN) 1000 MCG  tablet, Take 1,000 mcg by mouth daily., Disp: , Rfl:   Allergies  Allergen Reactions   No Known Allergies    Review of Systems Objective:  There were no vitals filed for this visit.  General: Well developed, nourished, in no acute distress, alert and oriented x3   Dermatological: Skin is warm, dry and supple bilateral. Nails x 10 are well maintained; remaining integument appears unremarkable at this time. There are no open sores, no preulcerative lesions, no rash or signs of infection present.  Sharp innervated nail margin along the fibular border of the hallux left.  Mild erythema no purulence no drainage.  Vascular: Dorsalis Pedis artery and Posterior Tibial artery pedal pulses are 2/4 bilateral with immedate capillary fill time. Pedal hair growth present. No varicosities and no lower extremity edema present bilateral.   Neruologic: Grossly intact via light touch bilateral. Vibratory intact via tuning fork bilateral. Protective threshold with Semmes Wienstein monofilament intact to all pedal sites bilateral. Patellar and Achilles deep tendon reflexes 2+ bilateral. No Babinski or clonus noted bilateral.  Skin is hyper sensate with allodynia type pain dorsal aspect of the foot left seems worse than the right  Musculoskeletal: No gross boney pedal deformities bilateral. No pain, crepitus, or limitation noted with foot and  ankle range of motion bilateral. Muscular strength 5/5 in all groups tested bilateral.  Gait: Unassisted, Nonantalgic.    Radiographs:  None taken  Assessment & Plan:   Assessment: Ingrown nail fibular border hallux left.  Query neuropathy or paresthesias from radicular origin.  Plan: Discussed etiology pathology conservative surgical therapies at this point we are referring him to neurology for lower extremity work-up.  Chemical matricectomy was performed to the fibular border today after local anesthetic was administered.  He tolerated procedure well without  complications.  He was provided with both oral and written home-going instructions for care and soaking of the toe as well as a prescription for Corticosporin otic to be applied twice daily after soaking.  I will follow-up with Eddie in 2 weeks.     Keaja Reaume T. [place], [place]

## 2021-04-13 ENCOUNTER — Encounter: Payer: Self-pay | Admitting: Neurology

## 2021-04-23 ENCOUNTER — Ambulatory Visit (INDEPENDENT_AMBULATORY_CARE_PROVIDER_SITE_OTHER): Payer: Medicare Other | Admitting: Podiatry

## 2021-04-23 ENCOUNTER — Other Ambulatory Visit: Payer: Self-pay

## 2021-04-23 DIAGNOSIS — L600 Ingrowing nail: Secondary | ICD-10-CM

## 2021-04-23 DIAGNOSIS — Z9889 Other specified postprocedural states: Secondary | ICD-10-CM

## 2021-04-23 NOTE — Progress Notes (Signed)
He presents today for follow-up of his matrixectomy left toe.  He states that is been doing just fine a little bit tender in the corner as he points to the redness.  States that he is continue to soak but was developing a rash from the Betadine.  Objective: Vital signs stable he is alert oriented x3 there is mild erythema to the proximal nail fold fibular border.  No purulence no malodor no signs of infection.  Assessment: Well-healing surgical toe.  Plan: I encouraged him to continue soak Epson salt warm water until the redness is gone away.  Should this worsen in any way he is to notify us immediately.

## 2021-05-11 ENCOUNTER — Encounter: Payer: Self-pay | Admitting: Neurology

## 2021-05-18 ENCOUNTER — Ambulatory Visit (INDEPENDENT_AMBULATORY_CARE_PROVIDER_SITE_OTHER): Payer: Medicare Other | Admitting: Neurology

## 2021-05-18 ENCOUNTER — Other Ambulatory Visit: Payer: Self-pay

## 2021-05-18 ENCOUNTER — Encounter: Payer: Self-pay | Admitting: Neurology

## 2021-05-18 VITALS — BP 114/76 | HR 71 | Ht 67.0 in | Wt 192.0 lb

## 2021-05-18 DIAGNOSIS — R202 Paresthesia of skin: Secondary | ICD-10-CM | POA: Diagnosis not present

## 2021-05-18 NOTE — Progress Notes (Signed)
[facility] Clinic Note - Initial Visit   Date: 05/18/21  [name] MRN: [id number] DOB: [birth year]   Dear Dr. Milinda Pointer:  Thank you for your kind referral of Alex Day for consultation of left foot paresthesias. Although his history is well known to you, please allow Alex Day to reiterate it for the purpose of our medical record. The patient was accompanied to the clinic by self.   History of Present Illness: Alex Day is a 80 y.o. left-handed male with PAF, hyperlipidemia, hypertension, d CAD presenting for evaluation of left foot numbness. For at least past 10 years, he has noticed abnormal sensation around the left ankle.  He has a hard time describing it, but says that there is a low level of numbness and tingling.  Symptoms are constant.  There are no exacerbating or alleviating factors such as rest or exercise.  He denies weakness or imbalance.  No back or radicular leg pain. For the past 2-3 weeks, he noticed intermittent shooting pain over a localized around at the base of the right 5th toe, it lasts 3 days and resolved. He recalls having NCS/EMG of the leg when he was living in [place].  He does not have the reports, but does not recall anything markedly abnormal that came from it and recalls it to be very painful.   He lives with is wife in a one-level home. 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   Past Medical History:  Diagnosis Date   Acid reflux    Blood clot in vein    Coronary artery disease    Hypercholesteremia    Hyperlipidemia    Hypertension     Past Surgical History:  Procedure Laterality Date   APPENDECTOMY     CHOLECYSTECTOMY     FACIAL RECONSTRUCTION SURGERY     HERNIA REPAIR     KNEE SURGERY     TONSILLECTOMY       Medications:  Outpatient Encounter Medications as of 05/18/2021  Medication Sig   apixaban (ELIQUIS) 5 MG TABS tablet Take by mouth.   atorvastatin  (LIPITOR) 40 MG tablet    calcium-vitamin D (OSCAL WITH D) 500-200 MG-UNIT tablet Take 1 tablet by mouth.   cycloSPORINE (RESTASIS) 0.05 % ophthalmic emulsion 1 drop 2 (two) times daily.   folic acid (FOLVITE) 1 MG tablet Take 1 mg by mouth daily.   magnesium 30 MG tablet Take 30 mg by mouth 2 (two) times daily.   methotrexate (RHEUMATREX) 2.5 MG tablet Take 2.5 mg by mouth once a week. Caution:Chemotherapy. Protect from light. Take 8 po q week   metoprolol succinate (TOPROL-XL) 50 MG 24 hr tablet Take by mouth.   Multiple Vitamin (MULTIVITAMIN) capsule Take by mouth.   NEOMYCIN-POLYMYXIN-HYDROCORTISONE (CORTISPORIN) 1 % SOLN OTIC solution Apply 1-2 drops to toe BID after soaking   Omega-3 Fatty Acids (FISH OIL) 1000 MG CAPS Take by mouth.   omeprazole (PRILOSEC) 20 MG capsule Take by mouth.   vitamin B-12 (CYANOCOBALAMIN) 1000 MCG tablet Take 1,000 mcg by mouth daily.   No facility-administered encounter medications on file as of 05/18/2021.    Allergies:  Allergies  Allergen Reactions   No Known Allergies     Family History: Family History  Problem Relation Age of Onset   Cancer Mother    Cancer Father     Social History: Social History   Tobacco Use   Smoking status: Never   Smokeless tobacco:  Never  Substance Use Topics   Alcohol use: No   Drug use: No   Social History   Social History Narrative   Not on file    Vital Signs:  BP 114/76   Pulse 71   Ht '5\' 7"'$  (1.702 m)   Wt 192 lb (87.1 kg)   SpO2 95%   BMI 30.07 kg/m    Neurological Exam: MENTAL STATUS including orientation to time, place, person, recent and remote memory, attention span and concentration, language, and fund of knowledge is normal.  Speech is not dysarthric.  CRANIAL NERVES: II:  No visual field defects.    III-IV-VI: Pupils equal round and reactive to light.  Normal conjugate, extra-ocular eye movements in all directions of gaze.  No nystagmus.  No ptosis.   V:  Normal facial sensation.     VII:  Normal facial symmetry and movements.   VIII:  Normal hearing and vestibular function.   IX-X:  Normal palatal movement.   XI:  Normal shoulder shrug and head rotation.   XII:  Normal tongue strength and range of motion, no deviation or fasciculation.  MOTOR:  No atrophy, fasciculations or abnormal movements.  No pronator drift.   Upper Extremity:  Right  Left  Deltoid  5/5   5/5   Biceps  5/5   5/5   Triceps  5/5   5/5   Infraspinatus 5/5  5/5  Medial pectoralis 5/5  5/5  Wrist extensors  5/5   5/5   Wrist flexors  5/5   5/5   Finger extensors  5/5   5/5   Finger flexors  5/5   5/5   Dorsal interossei  5/5   5/5   Abductor pollicis  5/5   5/5   Tone (Ashworth scale)  0  0   Lower Extremity:  Right  Left  Hip flexors  5/5   5/5   Hip extensors  5/5   5/5   Adductor 5/5  5/5  Abductor 5/5  5/5  Knee flexors  5/5   5/5   Knee extensors  5/5   5/5   Dorsiflexors  5/5   5/5   Plantarflexors  5/5   5/5   Toe extensors  5/5   5/5   Toe flexors  5/5   5/5   Tone (Ashworth scale)  0  0   MSRs:  Right        Left                  brachioradialis 2+  2+  biceps 2+  2+  triceps 2+  2+  patellar 2+  2+  ankle jerk 0  0  Hoffman no  no  plantar response down  down   SENSORY:  Reduced vibration at the ankles, absent at the toes.  Temperature and pin prick over the dorsum of the feet.   Romberg's sign absent.   COORDINATION/GAIT: Normal finger-to- nose-finger.  Intact rapid alternating movements bilaterally.  Able to rise from a chair without using arms.  Gait narrow based and stable. Tandem and stressed gait intact.    IMPRESSION: Bilateral feet paresthesias, worse on the left.  Although his is more aware of paresthesias in the left foot, his exam shows reduced sensation in both feet.  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  I recommend NCS/EMG of the legs to evaluate his symptoms, however, he recalls this being painful and prefers to hold on additional testing unless  symptoms get  worse.  At this time, he feels that it is more of an annoyance than painful or limiting his daily activities.     Return to clinic in 1 year  Thank you for allowing me to participate in patient's care.  If I can answer any additional questions, I would be pleased to do so.    Sincerely,    Diamantina Edinger K. Posey Pronto, DO

## 2021-05-18 NOTE — Patient Instructions (Addendum)
If your symptoms get worse, please contact my office.    Return to clinic in 1 year

## 2021-06-10 ENCOUNTER — Encounter: Payer: Self-pay | Admitting: Dermatology

## 2021-06-10 ENCOUNTER — Other Ambulatory Visit: Payer: Self-pay

## 2021-06-10 ENCOUNTER — Ambulatory Visit (INDEPENDENT_AMBULATORY_CARE_PROVIDER_SITE_OTHER): Payer: Medicare Other | Admitting: Dermatology

## 2021-06-10 DIAGNOSIS — C4441 Basal cell carcinoma of skin of scalp and neck: Secondary | ICD-10-CM

## 2021-06-10 DIAGNOSIS — L820 Inflamed seborrheic keratosis: Secondary | ICD-10-CM | POA: Diagnosis not present

## 2021-06-10 DIAGNOSIS — Z1283 Encounter for screening for malignant neoplasm of skin: Secondary | ICD-10-CM | POA: Diagnosis not present

## 2021-06-10 DIAGNOSIS — D485 Neoplasm of uncertain behavior of skin: Secondary | ICD-10-CM

## 2021-06-10 DIAGNOSIS — L729 Follicular cyst of the skin and subcutaneous tissue, unspecified: Secondary | ICD-10-CM | POA: Diagnosis not present

## 2021-06-10 MED ORDER — TRIAMCINOLONE ACETONIDE 10 MG/ML IJ SUSP: 10.0000 mg | Freq: Once | INTRAMUSCULAR | Status: AC

## 2021-06-10 NOTE — Patient Instructions (Signed)

## 2021-06-12 ENCOUNTER — Ambulatory Visit: Payer: Medicare Other | Admitting: Neurology

## 2021-06-17 ENCOUNTER — Telehealth: Payer: Self-pay | Admitting: *Deleted

## 2021-06-17 NOTE — Telephone Encounter (Signed)
-----   Message from Lavonna Monarch, MD sent at 06/17/2021  5:21 AM EDT ----- Previous biopsy showed inflammation, and this 1 showed a nonmelanoma skin cancer.  Schedule surgery with Dr. Darene Lamer but there is some chance I may recommend Mohs surgery.

## 2021-06-17 NOTE — Telephone Encounter (Signed)
Pathology to patient- surgery scheduled.

## 2021-06-21 ENCOUNTER — Encounter: Payer: Self-pay | Admitting: Dermatology

## 2021-06-21 NOTE — Progress Notes (Signed)
   Follow-Up Visit   Subjective  Alex Day is a 80 y.o. male who presents for the following: Follow-up (The back raised and draning like a cyst, also a tag like area on the back possible biopsy).  Inflamed cyst on back plus several areas to check Location:  Duration:  Quality:  Associated Signs/Symptoms: Modifying Factors:  Severity:  Timing: Context:   Objective  Well appearing patient in no apparent distress; mood and affect are within normal limits. Mid Back Smoldering nonfluctuant, nontender 2 sonometer cyst on back  Mid Back Waist up skin examination.  No atypical pigmented lesions.  2 possible carcinomas jawline and back will be biopsied.  Right Lower Back 7 mm waxy crust       Right Submandibular Area 1.8 cm pearly crust       All skin waist up examined.   Assessment & Plan    Cyst of skin Mid Back  Intralesional injection - Mid Back Location: back  Informed Consent: Discussed risks (infection, pain, bleeding, bruising, thinning of the skin, loss of skin pigment,  Indentation, lack of resolution, and recurrence of lesion) and benefits of the procedure, as well as the alternatives. Informed consent was obtained. Preparation: The area was prepared in a standard fashion.   Procedure Details: An intralesional injection was performed with Kenalog 10 mg/cc. 0.1 cc in total were injected.  Total number of injections: 1  Plan: The patient was instructed on post-op care. Recommend OTC analgesia as needed for pain.   triamcinolone acetonide (KENALOG) 10 MG/ML injection 10 mg - Mid Back   Screening for malignant neoplasm of skin Mid Back  Annual skin examination.  Neoplasm of uncertain behavior of skin (2) Right Lower Back  Skin / nail biopsy Type of biopsy: tangential   Informed consent: discussed and consent obtained   Timeout: patient name, date of birth, surgical site, and procedure verified   Anesthesia: the lesion was anesthetized in a  standard fashion   Anesthetic:  1% lidocaine w/ epinephrine 1-100,000 local infiltration Instrument used: flexible razor blade   Hemostasis achieved with: ferric subsulfate   Outcome: patient tolerated procedure well   Post-procedure details: wound care instructions given    Specimen 1 - Surgical pathology Differential Diagnosis: SCC vs BCC  Check Margins: No  Right Submandibular Area  Skin / nail biopsy Type of biopsy: tangential   Informed consent: discussed and consent obtained   Timeout: patient name, date of birth, surgical site, and procedure verified   Anesthesia: the lesion was anesthetized in a standard fashion   Anesthetic:  1% lidocaine w/ epinephrine 1-100,000 local infiltration Instrument used: flexible razor blade   Hemostasis achieved with: ferric subsulfate   Outcome: patient tolerated procedure well   Post-procedure details: wound care instructions given    Specimen 2 - Surgical pathology Differential Diagnosis: SCC vs BCC  Check Margins: No      I, Lavonna Monarch, MD, have reviewed all documentation for this visit.  The documentation on 06/21/21 for the exam, diagnosis, procedures, and orders are all accurate and complete.

## 2021-06-25 ENCOUNTER — Encounter: Payer: Medicare Other | Admitting: Dermatology

## 2021-07-09 ENCOUNTER — Ambulatory Visit (INDEPENDENT_AMBULATORY_CARE_PROVIDER_SITE_OTHER): Payer: Medicare Other | Admitting: Dermatology

## 2021-07-09 ENCOUNTER — Encounter: Payer: Self-pay | Admitting: Dermatology

## 2021-07-09 ENCOUNTER — Other Ambulatory Visit: Payer: Self-pay

## 2021-07-09 DIAGNOSIS — C4441 Basal cell carcinoma of skin of scalp and neck: Secondary | ICD-10-CM

## 2021-07-09 NOTE — Patient Instructions (Signed)

## 2021-07-20 ENCOUNTER — Encounter: Payer: Self-pay | Admitting: Dermatology

## 2021-07-20 NOTE — Progress Notes (Signed)
   Follow-Up Visit   Subjective  Alex Day is a 80 y.o. male who presents for the following: Procedure (Patient here today for treatment of BCC x 1 on right submandibular area).  BCC right jawline Location:  Duration:  Quality:  Associated Signs/Symptoms: Modifying Factors:  Severity:  Timing: Context:   Objective  Well appearing patient in no apparent distress; mood and affect are within normal limits. Right Submandibular Area Lesion identified by Dr.Anissa Abbs and nurse in room.      A focused examination was performed including head and neck.. Relevant physical exam findings are noted in the Assessment and Plan.   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  Destruction of lesion Complexity: simple   Destruction method: electrodesiccation and curettage   Informed consent: discussed and consent obtained   Timeout:  patient name, date of birth, surgical site, and procedure verified Anesthesia: the lesion was anesthetized in a standard fashion   Anesthetic:  1% lidocaine w/ epinephrine 1-100,000 local infiltration Curettage performed in three different directions: Yes   Electrodesiccation performed over the curetted area: Yes   Curettage cycles:  3 Lesion length (cm):  2 Lesion width (cm):  2 Margin per side (cm):  0 Final wound size (cm):  2 Hemostasis achieved with:  ferric subsulfate and electrodesiccation Outcome: patient tolerated procedure well with no complications   Post-procedure details: sterile dressing applied and wound care instructions given   Dressing type: bandage and petrolatum   Additional details:  Wound innoculated with 5 fluorouracil solution.      I, Lavonna Monarch, MD, have reviewed all documentation for this visit.  The documentation on 07/20/21 for the exam, diagnosis, procedures, and orders are all accurate and complete.

## 2021-07-24 ENCOUNTER — Ambulatory Visit (INDEPENDENT_AMBULATORY_CARE_PROVIDER_SITE_OTHER): Payer: Medicare Other | Admitting: Podiatry

## 2021-07-24 ENCOUNTER — Other Ambulatory Visit: Payer: Self-pay

## 2021-07-24 ENCOUNTER — Encounter: Payer: Self-pay | Admitting: Podiatry

## 2021-07-24 DIAGNOSIS — M79674 Pain in right toe(s): Secondary | ICD-10-CM

## 2021-07-24 DIAGNOSIS — B351 Tinea unguium: Secondary | ICD-10-CM

## 2021-07-24 DIAGNOSIS — M79675 Pain in left toe(s): Secondary | ICD-10-CM

## 2021-07-24 NOTE — Progress Notes (Signed)
This patient returns to my office for at risk foot care.  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  Patient is taking eliquis.  This patient is unable to cut nails himself since the patient cannot reach his nails.These nails are painful walking and wearing shoes.  This patient presents for at risk foot care today.  General Appearance  Alert, conversant and in no acute stress.  Vascular  Dorsalis pedis and posterior tibial  pulses are palpable  bilaterally.  Capillary return is within normal limits  bilaterally. Temperature is within normal limits  bilaterally.  Neurologic  Senn-Weinstein monofilament wire test within normal limits  bilaterally. Muscle power within normal limits bilaterally.  Nails Thick disfigured discolored nails with subungual debris hallux nails  bilaterally. No evidence of bacterial infection or drainage bilaterally.  Orthopedic  No limitations of motion  feet .  No crepitus or effusions noted.  No bony pathology or digital deformities noted.  Skin  normotropic skin with no porokeratosis noted bilaterally.  No signs of infections or ulcers noted.     Onychomycosis  Pain in right toes  Pain in left toes  Consent was obtained for treatment procedures.   Mechanical debridement of nails 1-5  bilaterally performed with a nail nipper.  Filed with dremel without incident.    Return office visit   3 months                  Told patient to return for periodic foot care and evaluation due to potential at risk complications.   Harlo Fabela DPM   

## 2021-08-11 ENCOUNTER — Ambulatory Visit: Payer: Medicare Other | Admitting: Dermatology

## 2021-08-19 ENCOUNTER — Ambulatory Visit: Payer: Medicare Other | Admitting: Dermatology

## 2021-10-07 DIAGNOSIS — M25562 Pain in left knee: Secondary | ICD-10-CM | POA: Insufficient documentation

## 2021-10-09 ENCOUNTER — Other Ambulatory Visit: Payer: Self-pay

## 2021-10-09 ENCOUNTER — Encounter: Payer: Self-pay | Admitting: Cardiovascular Disease

## 2021-10-09 ENCOUNTER — Ambulatory Visit (INDEPENDENT_AMBULATORY_CARE_PROVIDER_SITE_OTHER): Payer: Medicare Other | Admitting: Cardiovascular Disease

## 2021-10-09 VITALS — BP 106/78 | HR 84 | Ht 67.0 in | Wt 175.6 lb

## 2021-10-09 DIAGNOSIS — I10 Essential (primary) hypertension: Secondary | ICD-10-CM

## 2021-10-09 DIAGNOSIS — E782 Mixed hyperlipidemia: Secondary | ICD-10-CM | POA: Diagnosis not present

## 2021-10-09 DIAGNOSIS — I480 Paroxysmal atrial fibrillation: Secondary | ICD-10-CM | POA: Diagnosis not present

## 2021-10-09 NOTE — Assessment & Plan Note (Signed)
History of essential hypertension a blood pressure measured today at 106/78.  He is on metoprolol.

## 2021-10-09 NOTE — Patient Instructions (Signed)

## 2021-10-09 NOTE — Assessment & Plan Note (Signed)
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

## 2021-10-09 NOTE — Assessment & Plan Note (Signed)
History of hyperlipidemia on statin therapy with lipid profile performed 08/27/2021 revealing a total cholesterol 43, LDL 62 and HDL 33.

## 2021-10-09 NOTE — Assessment & Plan Note (Signed)
History of PAF maintaining sinus rhythm on Eliquis oral anticoagulation. 

## 2021-10-09 NOTE — Progress Notes (Signed)
10/09/2021 Alex Day   [birth year]  [id number]  Primary Physician Jacalyn Lefevre, Jesse Sans, MD Primary Cardiologist: Lorretta Harp MD Lupe Carney, [place]  HPI:  Alex Day is a 80 y.o.  mildly overweight married Caucasian male father of 1 daughter, grandfather of 13 grandson recently relocated from [place] to [place] to be closer to his family.  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  I last saw him in the office 10/07/2020. He was taken care of by a cardiologist, Dr. Posey Pronto in the [place] who last saw him in the office 08/07/2020 ([place] system).  He has never smoked and does not drink alcohol.  There is no family history of heart disease.  He is never had a heart attack or stroke.  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  There is also history of PAF.  He denies chest pain or shortness of breath.  He does not exercise but "putters around the house".  He had carotid and lower extremity Dopplers performed 2018 which were normal and a 2D echo that revealed normal LV systolic function without valvular abnormalities.  Since I saw him a year ago he continues to do well.  He started to diet and exercise and has lost over 10 pounds.  He denies chest pain or shortness of breath.   Current Meds  Medication Sig   apixaban (ELIQUIS) 5 MG TABS tablet Take by mouth 2 (two) times daily.   atorvastatin (LIPITOR) 40 MG tablet    calcium-vitamin D (OSCAL WITH D) 500-200 MG-UNIT tablet Take 1 tablet by mouth.   cycloSPORINE (RESTASIS) 0.05 % ophthalmic emulsion 1 drop 2 (two) times daily.   folic acid (FOLVITE) 1 MG tablet Take 1 mg by mouth daily.   magnesium 30 MG tablet Take 30 mg by mouth 2 (two) times daily.   methotrexate (RHEUMATREX) 2.5 MG tablet Take 2.5 mg by mouth once a week. Caution:Chemotherapy. Protect from light. Take 8 po q week   metoprolol succinate  (TOPROL-XL) 50 MG 24 hr tablet Take by mouth.   Multiple Vitamin (MULTIVITAMIN) capsule Take by mouth.   Omega-3 Fatty Acids (FISH OIL) 1000 MG CAPS Take by mouth.   omeprazole (PRILOSEC) 20 MG capsule Take by mouth 2 (two) times daily before a meal.   OZEMPIC, 1 MG/DOSE, 4 MG/3ML SOPN Inject 1 mg into the skin once a week.   vitamin B-12 (CYANOCOBALAMIN) 1000 MCG tablet Take 1,000 mcg by mouth daily.   Current Facility-Administered Medications for the 10/09/21 encounter (Office Visit) with Lorretta Harp, MD  Medication   triamcinolone acetonide (KENALOG) 10 MG/ML injection 10 mg     Allergies  Allergen Reactions   No Known Allergies     Social History   Socioeconomic History   Marital status: Married    Spouse name: Not on file   Number of children: Not on file   Years of education: Not on file   Highest education level: Not on file  Occupational History   Not on file  Tobacco Use   Smoking status: Never   Smokeless tobacco: Never  Vaping Use   Vaping Use: Never used  Substance and Sexual Activity   Alcohol use: No   Drug use: No   Sexual activity: Not Currently  Other Topics Concern   Not on file  Social  History Narrative   Not on file   Social Determinants of Health   Financial Resource Strain: Not on file  Food Insecurity: Not on file  Transportation Needs: Not on file  Physical Activity: Not on file  Stress: Not on file  Social Connections: Not on file  Intimate Partner Violence: Not on file     Review of Systems: General: negative for chills, fever, night sweats or weight changes.  Cardiovascular: negative for chest pain, dyspnea on exertion, edema, orthopnea, palpitations, paroxysmal nocturnal dyspnea or shortness of breath Dermatological: negative for rash Respiratory: negative for cough or wheezing Urologic: negative for hematuria Abdominal: negative for nausea, vomiting, diarrhea, bright red blood per rectum, melena, or hematemesis Neurologic:  negative for visual changes, syncope, or dizziness All other systems reviewed and are otherwise negative except as noted above.    Blood pressure 106/78, pulse 84, height 5\' 7"  (1.702 m), weight 175 lb 9.6 oz (79.7 kg), SpO2 98 %.  General appearance: alert and no distress Neck: no adenopathy, no carotid bruit, no JVD, supple, symmetrical, trachea midline, and thyroid not enlarged, symmetric, no tenderness/mass/nodules Lungs: clear to auscultation bilaterally Heart: regular rate and rhythm, S1, S2 normal, no murmur, click, rub or gallop Extremities: extremities normal, atraumatic, no cyanosis or edema Pulses: 2+ and symmetric Skin: Skin color, texture, turgor normal. No rashes or lesions Neurologic: Grossly normal  EKG sinus rhythm at 84 with right bundle branch block.  I personally reviewed this EKG.  ASSESSMENT AND PLAN:   Hyperlipidemia History of hyperlipidemia on statin therapy with lipid profile performed 08/27/2021 revealing a total cholesterol 43, LDL 62 and HDL 33.  History of pulmonary embolism History of remote pulmonary embolism on Eliquis oral anticoagulation.  PAF (paroxysmal atrial fibrillation) (HCC) History of PAF maintaining sinus rhythm on Eliquis oral anticoagulation.  Benign essential hypertension History of essential hypertension a blood pressure measured today at 106/78.  He is on metoprolol.     Lorretta Harp MD FACP,FACC,FAHA, [facility] 10/09/2021 8:58 AM

## 2021-10-13 ENCOUNTER — Ambulatory Visit (INDEPENDENT_AMBULATORY_CARE_PROVIDER_SITE_OTHER): Payer: Medicare Other | Admitting: Dermatology

## 2021-10-13 ENCOUNTER — Other Ambulatory Visit: Payer: Self-pay

## 2021-10-13 ENCOUNTER — Encounter: Payer: Self-pay | Admitting: Dermatology

## 2021-10-13 DIAGNOSIS — L570 Actinic keratosis: Secondary | ICD-10-CM | POA: Diagnosis not present

## 2021-10-13 DIAGNOSIS — L821 Other seborrheic keratosis: Secondary | ICD-10-CM | POA: Diagnosis not present

## 2021-10-13 DIAGNOSIS — Z1283 Encounter for screening for malignant neoplasm of skin: Secondary | ICD-10-CM

## 2021-10-13 DIAGNOSIS — Z85828 Personal history of other malignant neoplasm of skin: Secondary | ICD-10-CM | POA: Diagnosis not present

## 2021-10-23 ENCOUNTER — Encounter: Payer: Self-pay | Admitting: Podiatry

## 2021-10-23 ENCOUNTER — Ambulatory Visit (INDEPENDENT_AMBULATORY_CARE_PROVIDER_SITE_OTHER): Payer: Medicare Other | Admitting: Podiatry

## 2021-10-23 ENCOUNTER — Other Ambulatory Visit: Payer: Self-pay

## 2021-10-23 DIAGNOSIS — B351 Tinea unguium: Secondary | ICD-10-CM | POA: Diagnosis not present

## 2021-10-23 DIAGNOSIS — M79674 Pain in right toe(s): Secondary | ICD-10-CM

## 2021-10-23 DIAGNOSIS — M79675 Pain in left toe(s): Secondary | ICD-10-CM

## 2021-10-23 DIAGNOSIS — D689 Coagulation defect, unspecified: Secondary | ICD-10-CM | POA: Insufficient documentation

## 2021-10-23 NOTE — Progress Notes (Signed)
This patient returns to my office for at risk foot care.  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  Patient is taking eliquis.  This patient is unable to cut nails himself since the patient cannot reach his nails.These nails are painful walking and wearing shoes.  This patient presents for at risk foot care today.  General Appearance  Alert, conversant and in no acute stress.  Vascular  Dorsalis pedis and posterior tibial  pulses are palpable  bilaterally.  Capillary return is within normal limits  bilaterally. Temperature is within normal limits  bilaterally.  Neurologic  Senn-Weinstein monofilament wire test within normal limits  bilaterally. Muscle power within normal limits bilaterally.  Nails Thick disfigured discolored nails with subungual debris hallux nails  bilaterally. No evidence of bacterial infection or drainage bilaterally.  Orthopedic  No limitations of motion  feet .  No crepitus or effusions noted.  No bony pathology or digital deformities noted.  Skin  normotropic skin with no porokeratosis noted bilaterally.  No signs of infections or ulcers noted.     Onychomycosis  Pain in right toes  Pain in left toes  Consent was obtained for treatment procedures.   Mechanical debridement of nails 1-5  bilaterally performed with a nail nipper.  Filed with dremel without incident.    Return office visit   3 months                  Told patient to return for periodic foot care and evaluation due to potential at risk complications.   Jax Abdelrahman DPM   

## 2021-11-05 ENCOUNTER — Encounter: Payer: Self-pay | Admitting: Dermatology

## 2021-11-05 NOTE — Progress Notes (Signed)
° °  Follow-Up Visit   Subjective  Alex Day is a 81 y.o. male who presents for the following: Annual Exam (No new concerns).  Check site of skin cancer plus new spots on face Location:  Duration:  Quality:  Associated Signs/Symptoms: Modifying Factors:  Severity:  Timing: Context:   Objective  Well appearing patient in no apparent distress; mood and affect are within normal limits. Left Buccal Cheek Horn like for millimeter pink crust   Right Buccal Cheek Light brown flattopped textured 5 mm papule   Right Buccal Cheek No sign recurrence    A focused examination was performed including head and neck. Relevant physical exam findings are noted in the Assessment and Plan.   Assessment & Plan    Actinic keratosis Left Buccal Cheek  Destruction of lesion - Left Buccal Cheek Complexity: simple   Destruction method: cryotherapy   Informed consent: discussed and consent obtained   Lesion destroyed using liquid nitrogen: Yes   Cryotherapy cycles:  3 Outcome: patient tolerated procedure well with no complications   Post-procedure details: wound care instructions given    Seborrheic keratosis Right Buccal Cheek  Benign, ok to leave if stable  History of basal cell cancer Right Buccal Cheek  Recheck as needed change      I, Lavonna Monarch, MD, have reviewed all documentation for this visit.  The documentation on 11/05/21 for the exam, diagnosis, procedures, and orders are all accurate and complete.

## 2022-01-12 DIAGNOSIS — M1712 Unilateral primary osteoarthritis, left knee: Secondary | ICD-10-CM | POA: Insufficient documentation

## 2022-01-15 ENCOUNTER — Encounter: Payer: Self-pay | Admitting: Neurology

## 2022-01-22 ENCOUNTER — Encounter: Payer: Self-pay | Admitting: Podiatry

## 2022-01-22 ENCOUNTER — Ambulatory Visit (INDEPENDENT_AMBULATORY_CARE_PROVIDER_SITE_OTHER): Payer: Medicare Other | Admitting: Podiatry

## 2022-01-22 DIAGNOSIS — B351 Tinea unguium: Secondary | ICD-10-CM

## 2022-01-22 DIAGNOSIS — M79674 Pain in right toe(s): Secondary | ICD-10-CM | POA: Diagnosis not present

## 2022-01-22 DIAGNOSIS — M79675 Pain in left toe(s): Secondary | ICD-10-CM | POA: Diagnosis not present

## 2022-01-22 DIAGNOSIS — D689 Coagulation defect, unspecified: Secondary | ICD-10-CM | POA: Diagnosis not present

## 2022-01-22 NOTE — Progress Notes (Signed)
This patient returns to my office for at risk foot care.  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  Patient is taking eliquis.  This patient is unable to cut nails himself since the patient cannot reach his nails.These nails are painful walking and wearing shoes.  This patient presents for at risk foot care today.  General Appearance  Alert, conversant and in no acute stress.  Vascular  Dorsalis pedis and posterior tibial  pulses are palpable  bilaterally.  Capillary return is within normal limits  bilaterally. Temperature is within normal limits  bilaterally.  Neurologic  Senn-Weinstein monofilament wire test within normal limits  bilaterally. Muscle power within normal limits bilaterally.  Nails Thick disfigured discolored nails with subungual debris hallux nails  bilaterally. No evidence of bacterial infection or drainage bilaterally.  Orthopedic  No limitations of motion  feet .  No crepitus or effusions noted.  No bony pathology or digital deformities noted.  Skin  normotropic skin with no porokeratosis noted bilaterally.  No signs of infections or ulcers noted.     Onychomycosis  Pain in right toes  Pain in left toes  Consent was obtained for treatment procedures.   Mechanical debridement of nails 1-5  bilaterally performed with a nail nipper.  Filed with dremel without incident.    Return office visit   3 months                  Told patient to return for periodic foot care and evaluation due to potential at risk complications.   Avi Kerschner DPM   

## 2022-02-17 ENCOUNTER — Encounter: Payer: Self-pay | Admitting: Dermatology

## 2022-02-17 ENCOUNTER — Ambulatory Visit (INDEPENDENT_AMBULATORY_CARE_PROVIDER_SITE_OTHER): Payer: Medicare Other | Admitting: Dermatology

## 2022-02-17 DIAGNOSIS — Z1283 Encounter for screening for malignant neoplasm of skin: Secondary | ICD-10-CM | POA: Diagnosis not present

## 2022-02-17 DIAGNOSIS — Z85828 Personal history of other malignant neoplasm of skin: Secondary | ICD-10-CM | POA: Diagnosis not present

## 2022-02-17 DIAGNOSIS — L309 Dermatitis, unspecified: Secondary | ICD-10-CM

## 2022-02-17 DIAGNOSIS — L821 Other seborrheic keratosis: Secondary | ICD-10-CM

## 2022-02-17 MED ORDER — CLOBETASOL PROPIONATE 0.05 % EX FOAM: CUTANEOUS | 0 refills | Status: AC

## 2022-02-17 NOTE — Patient Instructions (Addendum)
CeraVe anti itch lotion for the chest and back 1-2 times a day apply lotion. If this does not improve in 1-2 weeks call the office we can give a stronger cream or foam clobetasol cash pay 35$ goodrx coupon given ?

## 2022-03-07 ENCOUNTER — Encounter: Payer: Self-pay | Admitting: Dermatology

## 2022-03-07 NOTE — Progress Notes (Signed)
? ?  Follow-Up Visit ?  ?Subjective  ?Alex Day is a 81 y.o. male who presents for the following: Annual Exam (No new concerns, personal h/o scc). ? ?General skin check, itching on scalp and torso ?Location:  ?Duration:  ?Quality:  ?Associated Signs/Symptoms: ?Modifying Factors:  ?Severity:  ?Timing: ?Context:  ? ?Objective  ?Well appearing patient in no apparent distress; mood and affect are within normal limits. ?General skin examination, no atypical pigmented lesions (all checked with dermoscopy), no new or recurrent nonmelanoma skin cancer.  Previous scc scar clear  ? ?Scalp ?Large seb k right scalp: 8 mm textured light brown flattopped papule ? ?Chest (Upper Torso, Anterior) ?Chest, scalp and back itch: There is only subtle inflammation.  Uncertain whether this is a variant of adult eczema/seborrheic dermatitis or if there is a component of primary pruritus/cutaneous dysesthesia. ? ? ? ?All skin waist up examined. ? ? ?Assessment & Plan  ? ? ?Screening exam for skin cancer ? ?Annual skin examination ? ?Seborrheic keratosis ?Scalp ? ?May leave if stable ? ?Dermatitis ?Chest (Upper Torso, Anterior) ? ?Initially we will try CeraVe itch relief lotion daily after bathing plus may use on a as needed basis.  If there is no improvement in 2 weeks, he will get clobetasol and apply this once daily after bathing (do not use on face or body folds).  Follow-up by MyChart or phone in 4 weeks. ? ?clobetasol (OLUX) 0.05 % topical foam - Chest (Upper Torso, Anterior) ?Apply to AA 1-2 times daily to as needed not for the face or folds ? ? ? ? ? ?I, Lavonna Monarch, MD, have reviewed all documentation for this visit.  The documentation on 03/07/22 for the exam, diagnosis, procedures, and orders are all accurate and complete. ?

## 2022-04-23 ENCOUNTER — Ambulatory Visit: Payer: Medicare Other | Admitting: Podiatry

## 2022-05-19 ENCOUNTER — Ambulatory Visit: Payer: Medicare Other | Admitting: Neurology

## 2022-06-25 ENCOUNTER — Ambulatory Visit (INDEPENDENT_AMBULATORY_CARE_PROVIDER_SITE_OTHER): Payer: Medicare Other | Admitting: Podiatry

## 2022-06-25 ENCOUNTER — Encounter: Payer: Self-pay | Admitting: Podiatry

## 2022-06-25 DIAGNOSIS — M79674 Pain in right toe(s): Secondary | ICD-10-CM | POA: Diagnosis not present

## 2022-06-25 DIAGNOSIS — D689 Coagulation defect, unspecified: Secondary | ICD-10-CM | POA: Diagnosis not present

## 2022-06-25 DIAGNOSIS — B351 Tinea unguium: Secondary | ICD-10-CM | POA: Diagnosis not present

## 2022-06-25 DIAGNOSIS — M79675 Pain in left toe(s): Secondary | ICD-10-CM | POA: Diagnosis not present

## 2022-06-25 NOTE — Progress Notes (Signed)
This patient returns to my office for at risk foot care.  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  Patient is taking eliquis.  This patient is unable to cut nails himself since the patient cannot reach his nails.These nails are painful walking and wearing shoes.  This patient presents for at risk foot care today.  General Appearance  Alert, conversant and in no acute stress.  Vascular  Dorsalis pedis and posterior tibial  pulses are palpable  bilaterally.  Capillary return is within normal limits  bilaterally. Temperature is within normal limits  bilaterally.  Neurologic  Senn-Weinstein monofilament wire test within normal limits  bilaterally. Muscle power within normal limits bilaterally.  Nails Thick disfigured discolored nails with subungual debris hallux nails  bilaterally. No evidence of bacterial infection or drainage bilaterally.  Orthopedic  No limitations of motion  feet .  No crepitus or effusions noted.  No bony pathology or digital deformities noted.  Skin  normotropic skin with no porokeratosis noted bilaterally.  No signs of infections or ulcers noted.     Onychomycosis  Pain in right toes  Pain in left toes  Consent was obtained for treatment procedures.   Mechanical debridement of nails 1-5  bilaterally performed with a nail nipper.  Filed with dremel without incident.    Return office visit   3 months                  Told patient to return for periodic foot care and evaluation due to potential at risk complications.   Nguyet Mercer DPM   

## 2022-08-01 ENCOUNTER — Other Ambulatory Visit: Payer: Self-pay

## 2022-08-01 ENCOUNTER — Encounter (HOSPITAL_BASED_OUTPATIENT_CLINIC_OR_DEPARTMENT_OTHER): Payer: Self-pay | Admitting: Emergency Medicine

## 2022-08-01 ENCOUNTER — Emergency Department (HOSPITAL_BASED_OUTPATIENT_CLINIC_OR_DEPARTMENT_OTHER)
Admission: EM | Admit: 2022-08-01 | Discharge: 2022-08-01 | Disposition: A | Discharge status: Home / Self Care | Payer: Medicare Other | Attending: Student | Admitting: Student

## 2022-08-01 DIAGNOSIS — I480 Paroxysmal atrial fibrillation: Secondary | ICD-10-CM | POA: Insufficient documentation

## 2022-08-01 DIAGNOSIS — Z79899 Other long term (current) drug therapy: Secondary | ICD-10-CM | POA: Insufficient documentation

## 2022-08-01 DIAGNOSIS — I10 Essential (primary) hypertension: Secondary | ICD-10-CM | POA: Diagnosis not present

## 2022-08-01 DIAGNOSIS — L0390 Cellulitis, unspecified: Secondary | ICD-10-CM | POA: Insufficient documentation

## 2022-08-01 DIAGNOSIS — Z7901 Long term (current) use of anticoagulants: Secondary | ICD-10-CM | POA: Diagnosis not present

## 2022-08-01 DIAGNOSIS — Z85828 Personal history of other malignant neoplasm of skin: Secondary | ICD-10-CM | POA: Diagnosis not present

## 2022-08-01 DIAGNOSIS — L089 Local infection of the skin and subcutaneous tissue, unspecified: Secondary | ICD-10-CM | POA: Insufficient documentation

## 2022-08-01 DIAGNOSIS — I2510 Atherosclerotic heart disease of native coronary artery without angina pectoris: Secondary | ICD-10-CM | POA: Diagnosis not present

## 2022-08-01 LAB — COMPREHENSIVE METABOLIC PANEL
ALT: 20 U/L (ref 0–44)
AST: 17 U/L (ref 15–41)
Albumin: 4.2 g/dL (ref 3.5–5.0)
Alkaline Phosphatase: 72 U/L (ref 38–126)
Anion gap: 9 (ref 5–15)
BUN: 20 mg/dL (ref 8–23)
CO2: 27 mmol/L (ref 22–32)
Calcium: 9.8 mg/dL (ref 8.9–10.3)
Chloride: 105 mmol/L (ref 98–111)
Creatinine, Ser: 1.16 mg/dL (ref 0.61–1.24)
GFR, Estimated: >60 mL/min (ref >60)
Glucose, Bld: 80 mg/dL (ref 70–99)
Potassium: 4.5 mmol/L (ref 3.5–5.1)
Sodium: 141 mmol/L (ref 135–145)
Total Bilirubin: 0.8 mg/dL (ref 0.3–1.2)
Total Protein: 6.5 g/dL (ref 6.5–8.1)

## 2022-08-01 LAB — CBC WITH DIFFERENTIAL/PLATELET
Abs Immature Granulocytes: 0.04 10*3/uL (ref 0.00–0.07)
Basophils Absolute: 0.1 10*3/uL (ref 0.0–0.1)
Basophils Relative: 1 %
Eosinophils Absolute: 0.3 10*3/uL (ref 0.0–0.5)
Eosinophils Relative: 4 %
HCT: 43.9 % (ref 39.0–52.0)
Hemoglobin: 15.2 g/dL (ref 13.0–17.0)
Immature Granulocytes: 1 %
Lymphocytes Relative: 23 %
Lymphs Abs: 1.5 10*3/uL (ref 0.7–4.0)
MCH: 32.8 pg (ref 26.0–34.0)
MCHC: 34.6 g/dL (ref 30.0–36.0)
MCV: 94.6 fL (ref 80.0–100.0)
Monocytes Absolute: 0.8 10*3/uL (ref 0.1–1.0)
Monocytes Relative: 12 %
Neutro Abs: 3.8 10*3/uL (ref 1.7–7.7)
Neutrophils Relative %: 59 %
Platelets: 163 10*3/uL (ref 150–400)
RBC: 4.64 MIL/uL (ref 4.22–5.81)
RDW: 13.6 % (ref 11.5–15.5)
WBC: 6.4 10*3/uL (ref 4.0–10.5)
nRBC: 0 % (ref 0.0–0.2)

## 2022-08-01 MED ORDER — CEFPODOXIME PROXETIL 200 MG PO TABS: 200.0000 mg | ORAL_TABLET | Freq: Two times a day (BID) | ORAL | 0 refills | Status: AC

## 2022-08-01 MED ORDER — SODIUM CHLORIDE 0.9 % IV SOLN
2.0000 g | Freq: Once | INTRAVENOUS | Status: AC
  Administered 2022-08-01: 2 g via INTRAVENOUS
  Filled 2022-08-01: qty 20

## 2022-08-01 NOTE — ED Triage Notes (Signed)
Pt has area on mid back that opened up and was draining ,happened 3 weeks ago, he went to urgent care and ordered doxycycline for a week , took all that, not any better, last Monday had ultrasound at urgent care ,didn't see anything ,sent off sample for culture, ordered another round of doxy, and not better. No fevers. Drainage yellow and bloody

## 2022-08-01 NOTE — ED Triage Notes (Signed)
Pt was scheduled for exploratory surgery on this next Tuesday but doesn't want to go.

## 2022-08-01 NOTE — ED Provider Notes (Signed)
[facility] EMERGENCY DEPT Provider Note  CSN: [id number] Arrival date & time: 08/01/22 1121  Chief Complaint(s) Wound Infection  HPI Alex Day is a 81 y.o. male with PMH HTN, HLD, CAD, paroxysmal A-fib on Eliquis who presents emergency department for evaluation of a wound infection.  Patient states that he started to notice a wound in his mid back approximately 3 weeks ago and went to urgent care where he was prescribed doxycycline without improvement.  He had active drainage of pus and blood from this wound and went back to urgent care where they performed an ultrasound did not see drainable material and for an unknown reason placed him back on doxycycline despite known treatment failure and referred him to general surgery.  Patient arrives today on his second round of doxycycline without improvement and states that he does not want to have a surgical procedure done and is requesting a second opinion.  Urgent care provider is concerned that primary source may be an insect bite, but patient does not remember any such bite occurring.  He denies any associated symptoms including chest pain, shortness of breath, fever, nausea, vomiting, chills.   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   in situ-left posterior neck(CX35FU)   Patient Active Problem List   Diagnosis Date Noted   Osteoarthritis of left knee 01/12/2022   Coagulation defect ([place]) 10/23/2021   Pain in joint of left knee 10/07/2021   Pain due to onychomycosis of toenails of both feet 07/24/2021   Acquired thrombophilia ([place]) 04/09/2021   Benign essential hypertension 04/09/2021   Benign prostatic hyperplasia with lower urinary tract symptoms 04/09/2021   Gastroesophageal reflux disease 04/09/2021   Hardening of the aorta (main artery of the  heart) ([place]) 04/09/2021   History of nephrolithiasis 04/09/2021   Rheumatoid arthritis ([place]) 04/09/2021   Abnormal prostate specific antigen 04/09/2021   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 Authorizing Provider  cefpodoxime (VANTIN) 200 MG tablet Take 1 tablet (200 mg total) by mouth 2 (two) times daily for 7 days. 08/01/22 08/08/22 Yes Kaydynce Pat, MD  apixaban (ELIQUIS) 5 MG TABS tablet Take by mouth 2 (two) times daily.    [provider]  atorvastatin (LIPITOR) 40 MG tablet  09/08/19   [provider]  calcium-vitamin D (OSCAL WITH D) 500-200 MG-UNIT tablet Take 1 tablet by mouth.    [provider]  clobetasol (OLUX) 0.05 % topical foam Apply to AA 1-2 times daily to as needed not for the face or folds 02/17/22   Lavonna Monarch, MD  cycloSPORINE (RESTASIS) 0.05 % ophthalmic emulsion 1 drop 2 (two) times daily.    [provider]  folic acid (FOLVITE) 1 MG tablet Take 1 mg by mouth daily.    [provider]  magnesium 30 MG tablet Take 30 mg by mouth 2 (two) times daily.    [provider]  methotrexate (RHEUMATREX) 2.5 MG tablet Take 2.5 mg by mouth once a  week. Caution:Chemotherapy. Protect from light. Take 8 po q week    [provider]  metoprolol succinate (TOPROL-XL) 50 MG 24 hr tablet Take by mouth.    [provider]  Multiple Vitamin (MULTIVITAMIN) capsule Take by mouth.    [provider]  NEOMYCIN-POLYMYXIN-HYDROCORTISONE (CORTISPORIN) 1 % SOLN OTIC solution Apply 1-2 drops to toe BID after soaking 04/09/21   Hyatt, Max T, DPM  Omega-3 Fatty  Acids (FISH OIL) 1000 MG CAPS Take by mouth.    [provider]  omeprazole (PRILOSEC) 20 MG capsule Take by mouth 2 (two) times daily before a meal.    [provider]  Semaglutide, 1 MG/DOSE, (OZEMPIC, 1 MG/DOSE,) 4 MG/3ML SOPN INJECT 1 MG UNDER THE SKIN ONCE A WEEK    [provider]  vitamin B-12 (CYANOCOBALAMIN) 1000 MCG tablet Take 1,000 mcg by mouth daily.    [provider]                                                                                                                                    Past Surgical History Past Surgical History:  Procedure Laterality Date   APPENDECTOMY     CHOLECYSTECTOMY     FACIAL RECONSTRUCTION SURGERY     HERNIA REPAIR     KNEE SURGERY     TONSILLECTOMY     Family History Family History  Problem Relation Age of Onset   Cancer Mother    Cancer Father     Social History Social History   Tobacco Use   Smoking status: Never   Smokeless tobacco: Never  Vaping Use   Vaping Use: Never used  Substance Use Topics   Alcohol use: No   Drug use: No   Allergies No known allergies  Review of Systems Review of Systems  Skin:  Positive for wound.    Physical Exam Vital Signs  I have reviewed the triage vital signs BP 126/70 (BP Location: Right Arm)   Pulse 60   Temp 97.8 F (36.6 C) (Oral)   Resp 19   SpO2 96%   Physical Exam Vitals and nursing note reviewed.  Constitutional:      General: He is not in acute distress.    Appearance: He is well-developed.  HENT:     Head: Normocephalic and atraumatic.  Eyes:     Conjunctiva/sclera: Conjunctivae normal.  Cardiovascular:     Rate and Rhythm: Normal rate and regular rhythm.     Heart sounds: No murmur heard. Pulmonary:     Effort: Pulmonary effort is normal. No respiratory distress.     Breath sounds: Normal breath sounds.  Abdominal:     Palpations: Abdomen is soft.     Tenderness: There is no abdominal tenderness.  Musculoskeletal:         General: No swelling.     Cervical back: Neck supple.  Skin:    General: Skin is  warm and dry.     Capillary Refill: Capillary refill takes less than 2 seconds.     Findings: Lesion present.  Neurological:     Mental Status: He is alert.  Psychiatric:        Mood and Affect: Mood normal.     ED Results and Treatments Labs (all labs ordered are listed, but only abnormal results are displayed) Labs Reviewed  COMPREHENSIVE METABOLIC PANEL  CBC WITH DIFFERENTIAL/PLATELET                                                                                                                          Radiology No results found.  Pertinent labs & imaging results that were available during my care of the patient were reviewed by me and considered in my medical decision making (see MDM for details).  Medications Ordered in ED Medications  cefTRIAXone (ROCEPHIN) 2 g in sodium chloride 0.9 % 100 mL IVPB (0 g Intravenous Stopped 08/01/22 1606)                                                                                                                                     Procedures Procedures  (including critical care time)  Medical Decision Making / ED Course   This patient presents to the ED for concern of wound, this involves an extensive number of treatment options, and is a complaint that carries with it a high risk of complications and morbidity.  The differential diagnosis includes cellulitis, abscess, erythema migrans, brown recluse bite, outpatient medication failure  MDM: Patient seen emergency room for evaluation of a wound.  Physical exam with a 4 cm area of erythema with oozing coming from I&D sites in the mid back.  Minimally tender to palpation but no obvious purulence at this time.  Laboratory evaluation unremarkable.  Given persistent treatment failure with doxycycline, low suspicion that this patient is actually suffering from an MRSA infection and will cover for strep  with a more broad-spectrum antibiotic.  Patient given single dose ceftriaxone and will be discharged on Vantin with outpatient PCP follow-up.  Considered use of dalbavancin in the setting but unfortunately we do not have this drug available at this facility.  Patient informed that if he suffers treatment failure with this round of antibiotics, he may need to return to the emergency department for hospital admission for persistent IV antibiotics.  Ow[place] Shark recluse  bite may remain on the differential given persistent erythematous lesion nonresponsive to antibiotics with painless bite (?), and if the patient begins to develop persistent necrosis of the wound he very well might need surgical consultation but at this time I have low suspicion for this.  The current ecological distribution of this species is primarily limited to [place] and the patient has no recent travel to this area.  Patient then discharged with outpatient follow-up   Additional history obtained:  -External records from outside source obtained and reviewed including: Chart review including previous notes, labs, imaging, consultation notes   Lab Tests: -I ordered, reviewed, and interpreted labs.   The pertinent results include:   Labs Reviewed  COMPREHENSIVE METABOLIC PANEL  CBC WITH DIFFERENTIAL/PLATELET    Medicines ordered and prescription drug management: Meds ordered this encounter  Medications   cefTRIAXone (ROCEPHIN) 2 g in sodium chloride 0.9 % 100 mL IVPB    Order Specific Question:   Antibiotic Indication:    Answer:   Cellulitis   cefpodoxime (VANTIN) 200 MG tablet    Sig: Take 1 tablet (200 mg total) by mouth 2 (two) times daily for 7 days.    Dispense:  14 tablet    Refill:  0    -I have reviewed the patients home medicines and have made adjustments as needed  Critical interventions none  Cardiac Monitoring: The patient was maintained on a cardiac monitor.  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

## 2022-08-01 NOTE — ED Notes (Signed)
Discharge paperwork given and verbally understood. 

## 2022-08-06 ENCOUNTER — Telehealth: Payer: Self-pay

## 2022-08-06 NOTE — Telephone Encounter (Signed)
     Patient  visit on 10/8  at [facility]   Have you been able to follow up with your primary care physician? YES  The patient was or was not able to obtain any needed medicine or equipment. YES  Are there diet recommendations that you are having difficulty following? NA  Patient expresses understanding of discharge instructions and education provided has no other needs at this time.  [place], [facility], Care Management  [phone] [address], [place], [place] [address] Phone: [phone] Email: [email or web address]

## 2022-08-25 ENCOUNTER — Ambulatory Visit (INDEPENDENT_AMBULATORY_CARE_PROVIDER_SITE_OTHER): Payer: Medicare Other | Admitting: Neurology

## 2022-08-25 ENCOUNTER — Encounter: Payer: Self-pay | Admitting: Neurology

## 2022-08-25 VITALS — BP 109/76 | HR 73 | Ht 67.0 in | Wt 182.0 lb

## 2022-08-25 DIAGNOSIS — R202 Paresthesia of skin: Secondary | ICD-10-CM

## 2022-08-25 NOTE — Patient Instructions (Signed)
Return to clinic as needed

## 2022-08-25 NOTE — Progress Notes (Signed)
Follow-up Visit   Date: 08/25/2022    Alex Day MRN: [id number] DOB: 05-Dec-1940    Alex Day is a 81 y.o. left-handed Caucasian male with PAF, hyperlipidemia, hypertension, CAD returning to the clinic for follow-up of bilateral feet paresthesias.  The patient was accompanied to the clinic by self.   IMPRESSION/PLAN: Bilateral feet parethesias, worse on the left.  Exam consistent with neuropathy.  In the past NCS/EMG was discussed and opted to defer testing unless symptoms progress.  Currently, there has been no significant change.  He denies painful paresthesias.  Recommend that he continue to monitor symptoms and if it gets worse, testing can be reconsidered.  Return to clinic as needed  --------------------------------------------- History of present illness: For at least past 10 years, he has noticed abnormal sensation around the left ankle. He has a hard time describing it, but says that there is a low level of numbness and tingling.  Symptoms are constant.  There are no exacerbating or alleviating factors such as rest or exercise.  He denies weakness or imbalance.  No back or radicular leg pain. For the past 2-3 weeks, he noticed intermittent shooting pain over a localized around at the base of the right 5th toe, it lasts 3 days and resolved. He recalls having NCS/EMG of the leg when he was living in [place].  He does not have the reports, but does not recall anything markedly abnormal that came from it and recalls it to be very painful.    He lives with is wife in a one-level home. 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   UPDATE 08/25/2022:  He is here for follow-up visit.  There has been no significant change in the numbness/tingling involving the left foot/ankle.  There is no pain associated with symptoms. He denies imbalance, falls, or weakness.     Medications:  Current Outpatient Medications on File Prior to Visit   Medication Sig Dispense Refill   apixaban (ELIQUIS) 5 MG TABS tablet Take by mouth 2 (two) times daily.     atorvastatin (LIPITOR) 40 MG tablet      calcium-vitamin D (OSCAL WITH D) 500-200 MG-UNIT tablet Take 1 tablet by mouth.     clobetasol (OLUX) 0.05 % topical foam Apply to AA 1-2 times daily to as needed not for the face or folds 50 g 0   cycloSPORINE (RESTASIS) 0.05 % ophthalmic emulsion 1 drop 2 (two) times daily.     folic acid (FOLVITE) 1 MG tablet Take 1 mg by mouth daily.     magnesium 30 MG tablet Take 30 mg by mouth 2 (two) times daily.     methotrexate (RHEUMATREX) 2.5 MG tablet Take 2.5 mg by mouth once a week. Caution:Chemotherapy. Protect from light. Take 8 po q week     metoprolol succinate (TOPROL-XL) 50 MG 24 hr tablet Take by mouth.     Multiple Vitamin (MULTIVITAMIN) capsule Take by mouth.     NEOMYCIN-POLYMYXIN-HYDROCORTISONE (CORTISPORIN) 1 % SOLN OTIC solution Apply 1-2 drops to toe BID after soaking 10 mL 1   Omega-3 Fatty Acids (FISH OIL) 1000 MG CAPS Take by mouth.     omeprazole (PRILOSEC) 20 MG capsule Take by mouth 2 (two) times daily before a meal.     Semaglutide, 1 MG/DOSE, (OZEMPIC, 1 MG/DOSE,) 4 MG/3ML SOPN INJECT 1 MG UNDER THE SKIN ONCE A WEEK     vitamin B-12 (CYANOCOBALAMIN) 1000 MCG tablet Take 1,000  mcg by mouth daily.     Current Facility-Administered Medications on File Prior to Visit  Medication Dose Route Frequency Provider Last Rate Last Admin   triamcinolone acetonide (KENALOG) 10 MG/ML injection 10 mg  10 mg Other Once Lavonna Monarch, MD        Allergies:  Allergies  Allergen Reactions   No Known Allergies     Vital Signs:  BP 109/76   Pulse 73   Ht '5\' 7"'$  (1.702 m)   Wt 182 lb (82.6 kg)   SpO2 96%   BMI 28.51 kg/m    Neurological Exam: MENTAL STATUS including orientation to time, place, person, recent and remote memory, attention span and concentration, language, and fund of knowledge is normal.  Speech is not  dysarthric.  CRANIAL NERVES:  Normal conjugate, extra-ocular eye movements in all directions of gaze.  No ptosis.  Face is symmetric. Palate elevates symmetrically.  Tongue is midline.  MOTOR:  Motor strength is 5/5 in all extremities, including distally in the feet.  No atrophy, fasciculations or abnormal movements.  No pronator drift.  Tone is normal.    MSRs:  Reflexes are 2+/4 throughout, except 1+/4 bilateral ankles.  SENSORY:  Intact to pin prick throughout, diminished vibration and temperature below the ankles. Rhomberg sign is present.   COORDINATION/GAIT:   Gait narrow based and stable.  Unsteady with tandem gait.   Data: n/a   Thank you for allowing me to participate in patient's care.  If I can answer any additional questions, I would be pleased to do so.    Sincerely,    Eban Weick K. Posey Pronto, DO

## 2022-09-24 ENCOUNTER — Ambulatory Visit (INDEPENDENT_AMBULATORY_CARE_PROVIDER_SITE_OTHER): Payer: Medicare Other | Admitting: Podiatry

## 2022-09-24 ENCOUNTER — Encounter: Payer: Self-pay | Admitting: Podiatry

## 2022-09-24 DIAGNOSIS — M79674 Pain in right toe(s): Secondary | ICD-10-CM | POA: Diagnosis not present

## 2022-09-24 DIAGNOSIS — M79675 Pain in left toe(s): Secondary | ICD-10-CM

## 2022-09-24 DIAGNOSIS — D689 Coagulation defect, unspecified: Secondary | ICD-10-CM

## 2022-09-24 DIAGNOSIS — B351 Tinea unguium: Secondary | ICD-10-CM

## 2022-09-24 NOTE — Progress Notes (Signed)
This patient returns to my office for at risk foot care.  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  Patient is taking eliquis.  This patient is unable to cut nails himself since the patient cannot reach his nails.These nails are painful walking and wearing shoes.  This patient presents for at risk foot care today.  General Appearance  Alert, conversant and in no acute stress.  Vascular  Dorsalis pedis and posterior tibial  pulses are palpable  bilaterally.  Capillary return is within normal limits  bilaterally. Temperature is within normal limits  bilaterally.  Neurologic  Senn-Weinstein monofilament wire test within normal limits  bilaterally. Muscle power within normal limits bilaterally.  Nails Thick disfigured discolored nails with subungual debris hallux nails  bilaterally. No evidence of bacterial infection or drainage bilaterally.  Orthopedic  No limitations of motion  feet .  No crepitus or effusions noted.  No bony pathology or digital deformities noted.  Skin  normotropic skin with no porokeratosis noted bilaterally.  No signs of infections or ulcers noted.     Onychomycosis  Pain in right toes  Pain in left toes  Consent was obtained for treatment procedures.   Mechanical debridement of nails 1-5  bilaterally performed with a nail nipper.  Filed with dremel without incident.    Return office visit   3 months                  Told patient to return for periodic foot care and evaluation due to potential at risk complications.   Brelee Renk DPM   

## 2022-10-13 ENCOUNTER — Ambulatory Visit: Payer: Medicare Other | Admitting: Dermatology

## 2022-12-09 ENCOUNTER — Encounter: Payer: Self-pay | Admitting: Cardiovascular Disease

## 2022-12-09 ENCOUNTER — Ambulatory Visit: Payer: Medicare Other | Attending: Cardiovascular Disease | Admitting: Cardiovascular Disease

## 2022-12-09 VITALS — BP 98/62 | HR 63 | Ht 67.0 in | Wt 183.4 lb

## 2022-12-09 DIAGNOSIS — I480 Paroxysmal atrial fibrillation: Secondary | ICD-10-CM

## 2022-12-09 DIAGNOSIS — I10 Essential (primary) hypertension: Secondary | ICD-10-CM

## 2022-12-09 DIAGNOSIS — E782 Mixed hyperlipidemia: Secondary | ICD-10-CM | POA: Diagnosis not present

## 2022-12-09 NOTE — Assessment & Plan Note (Signed)
PAF maintaining sinus rhythm on Eliquis oral anticoagulation.

## 2022-12-09 NOTE — Progress Notes (Signed)
12/09/2022 Alex Day   15-Jan-1941  EX:[id number]  Primary Physician Jacalyn Lefevre Jesse Sans, MD Primary Cardiologist: Lorretta Harp MD Renae Gloss  HPI:  Alex Day is a 82 y.o.    mildly overweight married Caucasian male father of 1 daughter, grandfather of 41 grandson recently relocated from [place] to [place] to be closer to his family.  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  I last saw him in the office 10/09/2021. He was taken care of by a cardiologist, Dr. Posey Pronto in the [place] who last saw him in the office 08/07/2020 ([address] system).  He has never smoked and does not drink alcohol.  There is no family history of heart disease.  He is never had a heart attack or stroke.  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  There is also history of PAF.  He denies chest pain or shortness of breath.  He does not exercise but "putters around the house".  He had carotid and lower extremity Dopplers performed 2018 which were normal and a 2D echo that revealed normal LV systolic function without valvular abnormalities.  Since I saw him a year ago he continues to do well.   He denies chest pain or shortness of breath.    Current Meds  Medication Sig   apixaban (ELIQUIS) 5 MG TABS tablet Take by mouth 2 (two) times daily.   atorvastatin (LIPITOR) 40 MG tablet    calcium-vitamin D (OSCAL WITH D) 500-200 MG-UNIT tablet Take 1 tablet by mouth.   cycloSPORINE (RESTASIS) 0.05 % ophthalmic emulsion 1 drop 2 (two) times daily.   folic acid (FOLVITE) 1 MG tablet Take 1 mg by mouth daily.   methotrexate (RHEUMATREX) 2.5 MG tablet Take 2.5 mg by mouth once a week. Caution:Chemotherapy. Protect from light. Take 8 po q week   metoprolol succinate (TOPROL-XL) 50 MG 24 hr tablet Take by mouth.   Multiple Vitamin (MULTIVITAMIN) capsule Take by mouth.    NEOMYCIN-POLYMYXIN-HYDROCORTISONE (CORTISPORIN) 1 % SOLN OTIC solution Apply 1-2 drops to toe BID after soaking   Omega-3 Fatty Acids (FISH OIL) 1000 MG CAPS Take by mouth.   omeprazole (PRILOSEC) 20 MG capsule Take by mouth 2 (two) times daily before a meal.   vitamin B-12 (CYANOCOBALAMIN) 1000 MCG tablet Take 1,000 mcg by mouth daily.   Current Facility-Administered Medications for the 12/09/22 encounter (Office Visit) with Lorretta Harp, MD  Medication   triamcinolone acetonide (KENALOG) 10 MG/ML injection 10 mg     Allergies  Allergen Reactions   No Known Allergies     Social History   Socioeconomic History   Marital status: Married    Spouse name: Not on file   Number of children: Not on file   Years of education: Not on file   Highest education level: Not on file  Occupational History   Not on file  Tobacco Use   Smoking status: Never   Smokeless tobacco: Never  Vaping Use   Vaping Use: Never used  Substance and Sexual Activity   Alcohol use: No   Drug use: No   Sexual activity: Not Currently  Other Topics Concern   Not on file  Social History Narrative   Not on file   Social Determinants of Health   Financial Resource Strain: Not on file  Food Insecurity:  Not on file  Transportation Needs: Not on file  Physical Activity: Not on file  Stress: Not on file  Social Connections: Not on file  Intimate Partner Violence: Not on file     Review of Systems: General: negative for chills, fever, night sweats or weight changes.  Cardiovascular: negative for chest pain, dyspnea on exertion, edema, orthopnea, palpitations, paroxysmal nocturnal dyspnea or shortness of breath Dermatological: negative for rash Respiratory: negative for cough or wheezing Urologic: negative for hematuria Abdominal: negative for nausea, vomiting, diarrhea, bright red blood per rectum, melena, or hematemesis Neurologic: negative for visual changes, syncope, or dizziness All other systems  reviewed and are otherwise negative except as noted above.    Blood pressure 98/62, pulse 63, height 5' 7"$  (1.702 m), weight 183 lb 6.4 oz (83.2 kg), SpO2 97 %.  General appearance: alert and no distress Neck: no adenopathy, no carotid bruit, no JVD, supple, symmetrical, trachea midline, and thyroid not enlarged, symmetric, no tenderness/mass/nodules Lungs: clear to auscultation bilaterally Heart: regular rate and rhythm, S1, S2 normal, no murmur, click, rub or gallop Extremities: extremities normal, atraumatic, no cyanosis or edema Pulses: 2+ and symmetric Skin: Skin color, texture, turgor normal. No rashes or lesions Neurologic: Grossly normal  EKG sinus rhythm at 66 with right bundle branch block.  I personally reviewed this EKG.  ASSESSMENT AND PLAN:   Hyperlipidemia History of hyperlipidemia on statin therapy with lipid profile performed 09/03/2022 revealing total cholesterol 61, LDL 80 and HDL of 37.  PAF (paroxysmal atrial fibrillation) (HCC) PAF maintaining sinus rhythm on Eliquis oral anticoagulation.  Benign essential hypertension History of essential hypertension with blood pressure measured at 98/62.  He is on metoprolol.     Lorretta Harp MD FACP,FACC,FAHA, [facility] 12/09/2022 8:43 AM

## 2022-12-09 NOTE — Assessment & Plan Note (Signed)
History of hyperlipidemia on statin therapy with lipid profile performed 09/03/2022 revealing total cholesterol 61, LDL 80 and HDL of 37.

## 2022-12-09 NOTE — Assessment & Plan Note (Signed)
History of essential hypertension with blood pressure measured at 98/62.  He is on metoprolol.

## 2022-12-09 NOTE — Patient Instructions (Signed)
Medication Instructions:  Your physician recommends that you continue on your current medications as directed. Please refer to the Current Medication list given to you today.  *If you need a refill on your cardiac medications before your next appointment, please call your pharmacy*   Follow-Up: At [facility], you and your health needs are our priority.  As part of our continuing mission to provide you with exceptional heart care, we have created designated Provider Care Teams.  These Care Teams include your primary Cardiologist (physician) and Advanced Practice Providers (APPs -  Physician Assistants and Nurse Practitioners) who all work together to provide you with the care you need, when you need it.  We recommend signing up for the patient portal called "MyChart".  Sign up information is provided on this After Visit Summary.  MyChart is used to connect with patients for Virtual Visits (Telemedicine).  Patients are able to view lab/test results, encounter notes, upcoming appointments, etc.  Non-urgent messages can be sent to your provider as well.   To learn more about what you can do with MyChart, go to [email or web address].    Your next appointment:   12 month(s)  Provider:   Quay Burow, MD

## 2022-12-24 ENCOUNTER — Ambulatory Visit (INDEPENDENT_AMBULATORY_CARE_PROVIDER_SITE_OTHER): Payer: Medicare Other | Admitting: Podiatry

## 2022-12-24 ENCOUNTER — Encounter: Payer: Self-pay | Admitting: Podiatry

## 2022-12-24 DIAGNOSIS — M79674 Pain in right toe(s): Secondary | ICD-10-CM

## 2022-12-24 DIAGNOSIS — B351 Tinea unguium: Secondary | ICD-10-CM

## 2022-12-24 DIAGNOSIS — D689 Coagulation defect, unspecified: Secondary | ICD-10-CM

## 2022-12-24 DIAGNOSIS — M79675 Pain in left toe(s): Secondary | ICD-10-CM

## 2022-12-24 NOTE — Progress Notes (Signed)
This patient returns to my office for at risk foot care.  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  Patient is taking eliquis.  This patient is unable to cut nails himself since the patient cannot reach his nails.These nails are painful walking and wearing shoes.  This patient presents for at risk foot care today.  General Appearance  Alert, conversant and in no acute stress.  Vascular  Dorsalis pedis and posterior tibial  pulses are palpable  bilaterally.  Capillary return is within normal limits  bilaterally. Temperature is within normal limits  bilaterally.  Neurologic  Senn-Weinstein monofilament wire test within normal limits  bilaterally. Muscle power within normal limits bilaterally.  Nails Thick disfigured discolored nails with subungual debris hallux nails  bilaterally. No evidence of bacterial infection or drainage bilaterally.  Orthopedic  No limitations of motion  feet .  No crepitus or effusions noted.  No bony pathology or digital deformities noted.  Skin  normotropic skin with no porokeratosis noted bilaterally.  No signs of infections or ulcers noted.     Onychomycosis  Pain in right toes  Pain in left toes  Consent was obtained for treatment procedures.   Mechanical debridement of nails 1-5  bilaterally performed with a nail nipper.  Filed with dremel without incident.    Return office visit   3 months                  Told patient to return for periodic foot care and evaluation due to potential at risk complications.   Gardiner Barefoot DPM

## 2023-02-21 ENCOUNTER — Ambulatory Visit: Payer: Medicare Other | Admitting: Dermatology

## 2023-03-25 ENCOUNTER — Encounter: Payer: Self-pay | Admitting: Podiatry

## 2023-03-25 ENCOUNTER — Ambulatory Visit (INDEPENDENT_AMBULATORY_CARE_PROVIDER_SITE_OTHER): Payer: Medicare Other | Admitting: Podiatry

## 2023-03-25 DIAGNOSIS — D689 Coagulation defect, unspecified: Secondary | ICD-10-CM | POA: Diagnosis not present

## 2023-03-25 DIAGNOSIS — M79675 Pain in left toe(s): Secondary | ICD-10-CM | POA: Diagnosis not present

## 2023-03-25 DIAGNOSIS — B351 Tinea unguium: Secondary | ICD-10-CM | POA: Diagnosis not present

## 2023-03-25 DIAGNOSIS — M79674 Pain in right toe(s): Secondary | ICD-10-CM

## 2023-03-25 NOTE — Progress Notes (Signed)
This patient returns to my office for at risk foot care.  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  Patient is taking eliquis.  This patient is unable to cut nails himself since the patient cannot reach his nails.These nails are painful walking and wearing shoes.  This patient presents for at risk foot care today. Patient says he injured his big toenail right foot.  General Appearance  Alert, conversant and in no acute stress.  Vascular  Dorsalis pedis and posterior tibial  pulses are palpable  bilaterally.  Capillary return is within normal limits  bilaterally. Temperature is within normal limits  bilaterally.  Neurologic  Senn-Weinstein monofilament wire test within normal limits  bilaterally. Muscle power within normal limits bilaterally.  Nails Thick disfigured discolored nails with subungual debris hallux nails  bilaterally. No evidence of bacterial infection or drainage bilaterally.  Orthopedic  No limitations of motion  feet .  No crepitus or effusions noted.  No bony pathology or digital deformities noted.  Skin  normotropic skin with no porokeratosis noted bilaterally.  No signs of infections or ulcers noted.     Onychomycosis  Pain in right toes  Pain in left toes  Consent was obtained for treatment procedures.   Mechanical debridement of nails 1-5  bilaterally performed with a nail nipper.  Filed with dremel without incident.    Return office visit   3 months                  Told patient to return for periodic foot care and evaluation due to potential at risk complications.   Helane Gunther DPM

## 2023-06-24 ENCOUNTER — Encounter: Payer: Self-pay | Admitting: Podiatry

## 2023-06-24 ENCOUNTER — Ambulatory Visit (INDEPENDENT_AMBULATORY_CARE_PROVIDER_SITE_OTHER): Payer: Medicare Other | Admitting: Podiatry

## 2023-06-24 DIAGNOSIS — M79675 Pain in left toe(s): Secondary | ICD-10-CM

## 2023-06-24 DIAGNOSIS — B351 Tinea unguium: Secondary | ICD-10-CM | POA: Diagnosis not present

## 2023-06-24 DIAGNOSIS — M79674 Pain in right toe(s): Secondary | ICD-10-CM | POA: Diagnosis not present

## 2023-06-24 DIAGNOSIS — D689 Coagulation defect, unspecified: Secondary | ICD-10-CM

## 2023-06-24 NOTE — Progress Notes (Signed)
This patient returns to my office for at risk foot care.  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  Patient is taking eliquis.  This patient is unable to cut nails himself since the patient cannot reach his nails.These nails are painful walking and wearing shoes.  This patient presents for at risk foot care today. Patient says he injured his big toenail right foot.  General Appearance  Alert, conversant and in no acute stress.  Vascular  Dorsalis pedis and posterior tibial  pulses are palpable  bilaterally.  Capillary return is within normal limits  bilaterally. Temperature is within normal limits  bilaterally.  Neurologic  Senn-Weinstein monofilament wire test within normal limits  bilaterally. Muscle power within normal limits bilaterally.  Nails Thick disfigured discolored nails with subungual debris hallux nails  bilaterally. No evidence of bacterial infection or drainage bilaterally.  Orthopedic  No limitations of motion  feet .  No crepitus or effusions noted.  No bony pathology or digital deformities noted.  Skin  normotropic skin with no porokeratosis noted bilaterally.  No signs of infections or ulcers noted.     Onychomycosis  Pain in right toes  Pain in left toes  Consent was obtained for treatment procedures.   Mechanical debridement of nails 1-5  bilaterally performed with a nail nipper.  Filed with dremel without incident.    Return office visit   3 months                  Told patient to return for periodic foot care and evaluation due to potential at risk complications.   Helane Gunther DPM

## 2023-09-26 ENCOUNTER — Encounter: Payer: Self-pay | Admitting: Podiatry

## 2023-09-26 ENCOUNTER — Ambulatory Visit (INDEPENDENT_AMBULATORY_CARE_PROVIDER_SITE_OTHER): Payer: Medicare Other | Admitting: Podiatry

## 2023-09-26 DIAGNOSIS — M79674 Pain in right toe(s): Secondary | ICD-10-CM

## 2023-09-26 DIAGNOSIS — M79675 Pain in left toe(s): Secondary | ICD-10-CM | POA: Diagnosis not present

## 2023-09-26 DIAGNOSIS — B351 Tinea unguium: Secondary | ICD-10-CM | POA: Diagnosis not present

## 2023-09-26 DIAGNOSIS — D689 Coagulation defect, unspecified: Secondary | ICD-10-CM

## 2023-09-26 NOTE — Progress Notes (Signed)
This patient returns to my office for at risk foot care.  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  Patient is taking eliquis.  This patient is unable to cut nails himself since the patient cannot reach his nails.These nails are painful walking and wearing shoes.  This patient presents for at risk foot care today. Patient says he injured his big toenail right foot.  General Appearance  Alert, conversant and in no acute stress.  Vascular  Dorsalis pedis and posterior tibial  pulses are palpable  bilaterally.  Capillary return is within normal limits  bilaterally. Temperature is within normal limits  bilaterally.  Neurologic  Senn-Weinstein monofilament wire test within normal limits  bilaterally. Muscle power within normal limits bilaterally.  Nails Thick disfigured discolored nails with subungual debris hallux nails  bilaterally. No evidence of bacterial infection or drainage bilaterally.  Orthopedic  No limitations of motion  feet .  No crepitus or effusions noted.  No bony pathology or digital deformities noted.  Skin  normotropic skin with no porokeratosis noted bilaterally.  No signs of infections or ulcers noted.     Onychomycosis  Pain in right toes  Pain in left toes  Consent was obtained for treatment procedures.   Mechanical debridement of nails 1-5  bilaterally performed with a nail nipper.  Filed with dremel without incident.    Return office visit   3 months                  Told patient to return for periodic foot care and evaluation due to potential at risk complications.   Helane Gunther DPM

## 2023-12-13 ENCOUNTER — Encounter: Payer: Self-pay | Admitting: Cardiovascular Disease

## 2023-12-13 ENCOUNTER — Ambulatory Visit: Payer: Medicare Other | Attending: Cardiovascular Disease | Admitting: Cardiovascular Disease

## 2023-12-13 VITALS — BP 98/66 | HR 60 | Ht 67.0 in | Wt 189.4 lb

## 2023-12-13 DIAGNOSIS — E782 Mixed hyperlipidemia: Secondary | ICD-10-CM | POA: Diagnosis present

## 2023-12-13 DIAGNOSIS — I10 Essential (primary) hypertension: Secondary | ICD-10-CM | POA: Diagnosis present

## 2023-12-13 DIAGNOSIS — I480 Paroxysmal atrial fibrillation: Secondary | ICD-10-CM | POA: Diagnosis present

## 2023-12-13 NOTE — Assessment & Plan Note (Signed)
History of essential hypertension with blood pressure measured today at 98/66.  He is on metoprolol.

## 2023-12-13 NOTE — Patient Instructions (Signed)

## 2023-12-13 NOTE — Progress Notes (Signed)
12/13/2023 Alex Day   Jun 05, 1941  [id number]  Primary Physician Nadene Rubins Nyoka Cowden, MD Primary Cardiologist: Runell Gess MD Nicholes Calamity, [place]  HPI:  Alex Day is a 83 y.o.   mildly overweight married Caucasian male father of 1 daughter, grandfather of 1 grandson recently relocated from [place] to [place] to be closer to his family.  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  I last saw him in the office 01/07/2023. He was taken care of by a cardiologist, Dr. Allena Katz in the [place] who last saw him in the office 08/07/2020 [facility]).  He has never smoked and does not drink alcohol.  There is no family history of heart disease.  He is never had a heart attack or stroke.  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  There is also history of PAF.  He denies chest pain or shortness of breath.  He does not exercise but "putters around the Day".  He had carotid and lower extremity Dopplers performed 2018 which were normal and a 2D echo that revealed normal LV systolic function without valvular abnormalities.  Since I saw him a year ago he continues to do well.   He denies chest pain or shortness of breath.      Current Meds  Medication Sig   apixaban (ELIQUIS) 5 MG TABS tablet Take by mouth 2 (two) times daily.   atorvastatin (LIPITOR) 40 MG tablet    calcium-vitamin D (OSCAL WITH D) 500-200 MG-UNIT tablet Take 1 tablet by mouth.   clobetasol (OLUX) 0.05 % topical foam Apply to AA 1-2 times daily to as needed not for the face or folds   cycloSPORINE (RESTASIS) 0.05 % ophthalmic emulsion 1 drop 2 (two) times daily.   folic acid (FOLVITE) 1 MG tablet Take 1 mg by mouth daily.   methotrexate (RHEUMATREX) 2.5 MG tablet Take 2.5 mg by mouth once a week. Caution:Chemotherapy. Protect from light. Take 8 po q week   metoprolol succinate (TOPROL-XL) 50 MG  24 hr tablet Take by mouth.   Multiple Vitamin (MULTIVITAMIN) capsule Take by mouth.   NEOMYCIN-POLYMYXIN-HYDROCORTISONE (CORTISPORIN) 1 % SOLN OTIC solution Apply 1-2 drops to toe BID after soaking   Omega-3 Fatty Acids (FISH OIL) 1000 MG CAPS Take by mouth.   omeprazole (PRILOSEC) 20 MG capsule Take by mouth 2 (two) times daily before a meal.   Semaglutide, 1 MG/DOSE, (OZEMPIC, 1 MG/DOSE,) 4 MG/3ML SOPN    vitamin B-12 (CYANOCOBALAMIN) 1000 MCG tablet Take 1,000 mcg by mouth daily.   Current Facility-Administered Medications for the 12/13/23 encounter (Office Visit) with Runell Gess, MD  Medication   triamcinolone acetonide (KENALOG) 10 MG/ML injection 10 mg     Allergies  Allergen Reactions   No Known Allergies     Social History   Socioeconomic History   Marital status: Married    Spouse name: Not on file   Number of children: Not on file   Years of education: Not on file   Highest education level: Not on file  Occupational History   Not on file  Tobacco Use   Smoking status: Never   Smokeless tobacco: Never  Vaping Use   Vaping status: Never Used  Substance and Sexual Activity   Alcohol use: No   Drug use: No   Sexual activity: Not  Currently  Other Topics Concern   Not on file  Social History Narrative   Not on file   Social Drivers of Health   Financial Resource Strain: Not on file  Food Insecurity: Not on file  Transportation Needs: Not on file  Physical Activity: Not on file  Stress: Not on file  Social Connections: Not on file  Intimate Partner Violence: Not on file     Review of Systems: General: negative for chills, fever, night sweats or weight changes.  Cardiovascular: negative for chest pain, dyspnea on exertion, edema, orthopnea, palpitations, paroxysmal nocturnal dyspnea or shortness of breath Dermatological: negative for rash Respiratory: negative for cough or wheezing Urologic: negative for hematuria Abdominal: negative for nausea,  vomiting, diarrhea, bright red blood per rectum, melena, or hematemesis Neurologic: negative for visual changes, syncope, or dizziness All other systems reviewed and are otherwise negative except as noted above.    Blood pressure 98/66, pulse 60, height 5\' 7"  (1.702 m), weight 189 lb 6.4 oz (85.9 kg), SpO2 93%.  General appearance: alert and no distress Neck: no adenopathy, no carotid bruit, no JVD, supple, symmetrical, trachea midline, and thyroid not enlarged, symmetric, no tenderness/mass/nodules Lungs: clear to auscultation bilaterally Heart: regular rate and rhythm, S1, S2 normal, no murmur, click, rub or gallop Extremities: extremities normal, atraumatic, no cyanosis or edema Pulses: 2+ and symmetric Skin: Skin color, texture, turgor normal. No rashes or lesions Neurologic: Grossly normal  EKG EKG Interpretation Date/Time:  Tuesday December 13 2023 08:04:07 EST Ventricular Rate:  60 PR Interval:  172 QRS Duration:  134 QT Interval:  416 QTC Calculation: 416 R Axis:   36  Text Interpretation: Normal sinus rhythm Right bundle branch block T wave abnormality, consider inferolateral ischemia No previous ECGs available Confirmed by Nanetta Batty [id number]) on 12/13/2023 8:09:56 AM    ASSESSMENT AND PLAN:   Hyperlipidemia History of hyperlipidemia on atorvastatin lipid profile performed 09/13/2023 revealing total cholesterol 152, LDL 50 and HDL 35.  PAF (paroxysmal atrial fibrillation) (HCC) History of PAF maintaining sinus rhythm on Eliquis oral anticoagulation.  He also has a history of remote PE.  Benign essential hypertension History of essential hypertension with blood pressure measured today at 98/66.  He is on metoprolol.     Runell Gess MD FACP,FACC,FAHA, [facility] 12/13/2023 8:18 AM

## 2023-12-13 NOTE — Assessment & Plan Note (Signed)
History of hyperlipidemia on atorvastatin lipid profile performed 09/13/2023 revealing total cholesterol 152, LDL 50 and HDL 35.

## 2023-12-13 NOTE — Assessment & Plan Note (Signed)
History of PAF maintaining sinus rhythm on Eliquis oral anticoagulation.  He also has a history of remote PE.

## 2023-12-26 ENCOUNTER — Ambulatory Visit (INDEPENDENT_AMBULATORY_CARE_PROVIDER_SITE_OTHER): Payer: Medicare Other | Admitting: Podiatry

## 2023-12-26 ENCOUNTER — Encounter: Payer: Self-pay | Admitting: Podiatry

## 2023-12-26 VITALS — Ht 67.0 in

## 2023-12-26 DIAGNOSIS — M79675 Pain in left toe(s): Secondary | ICD-10-CM | POA: Diagnosis not present

## 2023-12-26 DIAGNOSIS — M79674 Pain in right toe(s): Secondary | ICD-10-CM | POA: Diagnosis not present

## 2023-12-26 DIAGNOSIS — D689 Coagulation defect, unspecified: Secondary | ICD-10-CM | POA: Diagnosis not present

## 2023-12-26 DIAGNOSIS — B351 Tinea unguium: Secondary | ICD-10-CM

## 2023-12-26 NOTE — Progress Notes (Signed)
This patient returns to my office for at risk foot care.  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  Patient is taking eliquis.  This patient is unable to cut nails himself since the patient cannot reach his nails.These nails are painful walking and wearing shoes.  This patient presents for at risk foot care today. Patient says he injured his big toenail right foot.  General Appearance  Alert, conversant and in no acute stress.  Vascular  Dorsalis pedis and posterior tibial  pulses are palpable  bilaterally.  Capillary return is within normal limits  bilaterally. Temperature is within normal limits  bilaterally.  Neurologic  Senn-Weinstein monofilament wire test within normal limits  bilaterally. Muscle power within normal limits bilaterally.  Nails Thick disfigured discolored nails with subungual debris hallux nails  bilaterally. No evidence of bacterial infection or drainage bilaterally.  Orthopedic  No limitations of motion  feet .  No crepitus or effusions noted.  No bony pathology or digital deformities noted.  Skin  normotropic skin with no porokeratosis noted bilaterally.  No signs of infections or ulcers noted.     Onychomycosis  Pain in right toes  Pain in left toes  Consent was obtained for treatment procedures.   Mechanical debridement of nails 1-5  bilaterally performed with a nail nipper.  Filed with dremel without incident.    Return office visit   3 months                  Told patient to return for periodic foot care and evaluation due to potential at risk complications.   Helane Gunther DPM

## 2024-01-17 ENCOUNTER — Other Ambulatory Visit: Payer: Self-pay

## 2024-01-17 DIAGNOSIS — I480 Paroxysmal atrial fibrillation: Secondary | ICD-10-CM

## 2024-01-17 MED ORDER — APIXABAN 5 MG PO TABS: 5.0000 mg | ORAL_TABLET | Freq: Two times a day (BID) | ORAL | 1 refills | Status: DC

## 2024-01-17 NOTE — Telephone Encounter (Signed)
 Prescription refill request for Eliquis received. Indication: Afib/DVT Last office visit: 12/13/23 [name])  Scr: 1.16 (08/01/22)  Age: 82 Weight: 85.9kg  Appropriate dose. Refill sent.

## 2024-03-05 ENCOUNTER — Encounter: Payer: Self-pay | Admitting: Podiatry

## 2024-03-05 ENCOUNTER — Ambulatory Visit (INDEPENDENT_AMBULATORY_CARE_PROVIDER_SITE_OTHER): Admitting: Podiatry

## 2024-03-05 DIAGNOSIS — M79675 Pain in left toe(s): Secondary | ICD-10-CM | POA: Diagnosis not present

## 2024-03-05 DIAGNOSIS — B351 Tinea unguium: Secondary | ICD-10-CM | POA: Diagnosis not present

## 2024-03-05 DIAGNOSIS — D689 Coagulation defect, unspecified: Secondary | ICD-10-CM

## 2024-03-05 DIAGNOSIS — M79674 Pain in right toe(s): Secondary | ICD-10-CM

## 2024-03-05 NOTE — Progress Notes (Signed)
 This patient returns to my office for at risk foot care.  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  Patient is taking eliquis .  This patient is unable to cut nails himself since the patient cannot reach his nails.These nails are painful walking and wearing shoes.  This patient presents for at risk foot care today. Patient says he injured his big toenail right foot.  General Appearance  Alert, conversant and in no acute stress.  Vascular  Dorsalis pedis and posterior tibial  pulses are palpable  bilaterally.  Capillary return is within normal limits  bilaterally. Temperature is within normal limits  bilaterally.  Neurologic  Senn-Weinstein monofilament wire test within normal limits  bilaterally. Muscle power within normal limits bilaterally.  Nails Thick disfigured discolored nails with subungual debris hallux nails  bilaterally. No evidence of bacterial infection or drainage bilaterally.  Orthopedic  No limitations of motion  feet .  No crepitus or effusions noted.  No bony pathology or digital deformities noted.  Skin  normotropic skin with no porokeratosis noted bilaterally.  No signs of infections or ulcers noted.     Onychomycosis  Pain in right toes  Pain in left toes  Consent was obtained for treatment procedures.   Mechanical debridement of nails 1-5  bilaterally performed with a nail nipper.  Filed with dremel without incident.    Return office visit   10 weeks                   Told patient to return for periodic foot care and evaluation due to potential at risk complications.   Ruffin Cotton DPM

## 2024-05-21 ENCOUNTER — Encounter: Payer: Self-pay | Admitting: Podiatry

## 2024-05-21 ENCOUNTER — Ambulatory Visit (INDEPENDENT_AMBULATORY_CARE_PROVIDER_SITE_OTHER): Admitting: Podiatry

## 2024-05-21 DIAGNOSIS — B351 Tinea unguium: Secondary | ICD-10-CM

## 2024-05-21 DIAGNOSIS — M79674 Pain in right toe(s): Secondary | ICD-10-CM

## 2024-05-21 DIAGNOSIS — M79675 Pain in left toe(s): Secondary | ICD-10-CM | POA: Diagnosis not present

## 2024-05-21 DIAGNOSIS — D689 Coagulation defect, unspecified: Secondary | ICD-10-CM

## 2024-05-21 NOTE — Progress Notes (Signed)
 This patient returns to my office for at risk foot care.  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  Patient is taking eliquis .  This patient is unable to cut nails himself since the patient cannot reach his nails.These nails are painful walking and wearing shoes.  This patient presents for at risk foot care today. Patient says he injured his big toenail right foot.  General Appearance  Alert, conversant and in no acute stress.  Vascular  Dorsalis pedis and posterior tibial  pulses are palpable  bilaterally.  Capillary return is within normal limits  bilaterally. Temperature is within normal limits  bilaterally.  Neurologic  Senn-Weinstein monofilament wire test within normal limits  bilaterally. Muscle power within normal limits bilaterally.  Nails Thick disfigured discolored nails with subungual debris hallux nails  bilaterally. No evidence of bacterial infection or drainage bilaterally.  Orthopedic  No limitations of motion  feet .  No crepitus or effusions noted.  No bony pathology or digital deformities noted.  Skin  normotropic skin with no porokeratosis noted bilaterally.  No signs of infections or ulcers noted.     Onychomycosis  Pain in right toes  Pain in left toes  Consent was obtained for treatment procedures.   Mechanical debridement of nails 1-5  bilaterally performed with a nail nipper.  Filed with dremel without incident.    Return office visit   10 weeks                   Told patient to return for periodic foot care and evaluation due to potential at risk complications.   Ruffin Cotton DPM

## 2024-06-11 ENCOUNTER — Other Ambulatory Visit: Payer: Self-pay | Admitting: Cardiovascular Disease

## 2024-06-11 DIAGNOSIS — I480 Paroxysmal atrial fibrillation: Secondary | ICD-10-CM

## 2024-06-11 NOTE — Telephone Encounter (Signed)
 Prescription refill request for Eliquis  received. Indication: PAF/DVT/PE Last office visit: 12/13/23  [name] MD Scr:1.11 on 06/05/24  [facility] Age: 83 Weight: 85.9kg  Based on above findings Eliquis  5mg  twice daily is the appropriate dose.  Refill approved.

## 2024-08-02 ENCOUNTER — Encounter: Payer: Self-pay | Admitting: Podiatry

## 2024-08-02 ENCOUNTER — Ambulatory Visit (INDEPENDENT_AMBULATORY_CARE_PROVIDER_SITE_OTHER): Admitting: Podiatry

## 2024-08-02 DIAGNOSIS — M79674 Pain in right toe(s): Secondary | ICD-10-CM | POA: Diagnosis not present

## 2024-08-02 DIAGNOSIS — B351 Tinea unguium: Secondary | ICD-10-CM | POA: Diagnosis not present

## 2024-08-02 DIAGNOSIS — M79675 Pain in left toe(s): Secondary | ICD-10-CM

## 2024-08-02 DIAGNOSIS — D689 Coagulation defect, unspecified: Secondary | ICD-10-CM | POA: Diagnosis not present

## 2024-08-02 NOTE — Progress Notes (Signed)
 This patient returns to my office for at risk foot care.  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  Patient is taking eliquis .  This patient is unable to cut nails himself since the patient cannot reach his nails.These nails are painful walking and wearing shoes.  This patient presents for at risk foot care today. Patient says he injured his big toenail right foot.  General Appearance  Alert, conversant and in no acute stress.  Vascular  Dorsalis pedis and posterior tibial  pulses are palpable  bilaterally.  Capillary return is within normal limits  bilaterally. Temperature is within normal limits  bilaterally.  Neurologic  Senn-Weinstein monofilament wire test within normal limits  bilaterally. Muscle power within normal limits bilaterally.  Nails Thick disfigured discolored nails with subungual debris hallux nails  bilaterally. No evidence of bacterial infection or drainage bilaterally.  Orthopedic  No limitations of motion  feet .  No crepitus or effusions noted.  No bony pathology or digital deformities noted.  Skin  normotropic skin with no porokeratosis noted bilaterally.  No signs of infections or ulcers noted.     Onychomycosis  Pain in right toes  Pain in left toes  Consent was obtained for treatment procedures.   Mechanical debridement of nails 1-5  bilaterally performed with a nail nipper.  Filed with dremel without incident.    Return office visit   10 weeks                   Told patient to return for periodic foot care and evaluation due to potential at risk complications.   Ruffin Cotton DPM

## 2024-10-11 ENCOUNTER — Ambulatory Visit: Admitting: Podiatry

## 2024-10-24 ENCOUNTER — Ambulatory Visit: Admitting: Podiatry

## 2024-10-26 ENCOUNTER — Ambulatory Visit: Admitting: Podiatry

## 2024-10-30 ENCOUNTER — Ambulatory Visit: Payer: Self-pay | Admitting: Neurology

## 2024-10-30 ENCOUNTER — Encounter: Payer: Self-pay | Admitting: Neurology

## 2024-10-30 VITALS — BP 119/73 | HR 78 | Ht 67.0 in | Wt 194.0 lb

## 2024-10-30 DIAGNOSIS — M5431 Sciatica, right side: Secondary | ICD-10-CM

## 2024-10-30 NOTE — Progress Notes (Signed)
 "   Follow-up Visit   Date: 10/30/2024    Alex Day MRN: [id number] DOB: 07-05-1941    Alex Day is a 84 y.o. left-handed Caucasian male with PAF, hyperlipidemia, hypertension, CAD returning to the clinic for follow-up of bilateral feet paresthesias.  The patient was accompanied to the clinic by self.   IMPRESSION/PLAN: Assessment & Plan Right-sided sciatica.  Intermittent right-sided sciatica with pain from buttocks to leg, improving over last few days.  - Initiated referral for physical therapy. - Consider muscle relaxant, if pain gets worse  Return to clinic if symptoms do not improve   --------------------------------------------- UPDATE 10/30/2024:   Discussed the use of AI scribe software for clinical note transcription with the patient, who gave verbal consent to proceed.  History of Present Illness Alex Day is an 84 year old male who presents with intermittent right leg pain.  He has been experiencing intermittent pain posterior right leg for the past three to four weeks. The pain is described as a nerve firing sensation, occurring once to three times a day, with some days being symptom-free. It is localized from the buttocks down into the back of the right leg and typically occurs when walking or standing. Occasionally, it happens while lying in bed, but this is rare. Each episode lasts less than five seconds, and the pain can recur up to three times in a minute.  The pain does not radiate to his foot and is confined to a specific area of the leg. No falls have occurred, and there is no significant low back pain. No associated weakness.  Previously, he had numbness in his feet, which has improved significantly.   Medications:  Current Outpatient Medications on File Prior to Visit  Medication Sig Dispense Refill   atorvastatin (LIPITOR) 40 MG tablet      calcium-vitamin D (OSCAL WITH D) 500-200 MG-UNIT tablet Take 1 tablet by mouth.     clobetasol   (OLUX ) 0.05 % topical foam Apply to AA 1-2 times daily to as needed not for the face or folds 50 g 0   cycloSPORINE (RESTASIS) 0.05 % ophthalmic emulsion 1 drop 2 (two) times daily.     ELIQUIS  5 MG TABS tablet TAKE 1 TABLET BY MOUTH 2 TIMES DAILY 180 tablet 1   folic acid (FOLVITE) 1 MG tablet Take 1 mg by mouth daily.     methotrexate (RHEUMATREX) 2.5 MG tablet Take 2.5 mg by mouth once a week. Caution:Chemotherapy. Protect from light. Take 8 po q week     metoprolol succinate (TOPROL-XL) 50 MG 24 hr tablet Take by mouth.     Multiple Vitamin (MULTIVITAMIN) capsule Take by mouth.     NEOMYCIN -POLYMYXIN-HYDROCORTISONE (CORTISPORIN) 1 % SOLN OTIC solution Apply 1-2 drops to toe BID after soaking 10 mL 1   Omega-3 Fatty Acids (FISH OIL) 1000 MG CAPS Take by mouth.     omeprazole (PRILOSEC) 20 MG capsule Take by mouth 2 (two) times daily before a meal.     vitamin B-12 (CYANOCOBALAMIN) 1000 MCG tablet Take 1,000 mcg by mouth daily.     Semaglutide, 1 MG/DOSE, (OZEMPIC, 1 MG/DOSE,) 4 MG/3ML SOPN  (Patient not taking: Reported on 10/30/2024)     Current Facility-Administered Medications on File Prior to Visit  Medication Dose Route Frequency Provider Last Rate Last Admin   triamcinolone  acetonide (KENALOG ) 10 MG/ML injection 10 mg  10 mg Other Once Tafeen, Stuart, MD        Allergies:  Allergies  Allergen Reactions  No Known Allergies     Vital Signs:  BP 119/73   Pulse 78   Ht 5' 7 (1.702 m)   Wt 194 lb (88 kg)   SpO2 93%   BMI 30.38 kg/m    Neurological Exam: MENTAL STATUS including orientation to time, place, person, recent and remote memory, attention span and concentration, language, and fund of knowledge is normal.  Speech is not dysarthric.  CRANIAL NERVES:  Normal conjugate, extra-ocular eye movements in all directions of gaze.  No ptosis.  Face is symmetric. Palate elevates symmetrically.  Tongue is midline.  MOTOR:  Motor strength is 5/5 in all extremities, including  distally in the feet.  No atrophy, fasciculations or abnormal movements.  No pronator drift.  Tone is normal.    MSRs:  Reflexes are 2+/4 throughout, except 1+/4 bilateral ankles.  SENSORY:  Vibration intact at the ankles bilaterally.    COORDINATION/GAIT:   Gait narrow based and stable.    Data: n/a   Thank you for allowing me to participate in patient's care.  If I can answer any additional questions, I would be pleased to do so.    Sincerely,    Avielle Imbert K. Tobie, DO   "

## 2024-10-30 NOTE — Patient Instructions (Signed)
 Referral placed to start physical therapy for right leg pain

## 2024-11-13 ENCOUNTER — Telehealth: Payer: Self-pay | Admitting: Neurology

## 2024-11-13 MED ORDER — TIZANIDINE HCL 2 MG PO TABS: 2.0000 mg | ORAL_TABLET | Freq: Every evening | ORAL | 3 refills | Status: AC | PRN

## 2024-11-13 NOTE — Telephone Encounter (Signed)
 Tizanidine  2mg  at bedtime as needed for low back pain has been sent to his pharmacy.

## 2024-11-13 NOTE — Telephone Encounter (Signed)
 Pt called and LM with AN. He would like a muscle relaxer. States Dr.patel offered it to him at his last appt but he denied.he would like a call back

## 2024-11-13 NOTE — Telephone Encounter (Signed)
 Called patient and informed him that medication was sent to pharmacy. Patient verbalized understanding and had no questions.

## 2024-11-14 ENCOUNTER — Telehealth: Payer: Self-pay | Admitting: Neurology

## 2024-11-14 NOTE — Telephone Encounter (Signed)
 I have updated his pharmacy preferences.

## 2024-11-14 NOTE — Telephone Encounter (Signed)
 Pt would like all future Rx's sent to [facility] eyw#[phone]

## 2024-11-21 ENCOUNTER — Encounter: Payer: Self-pay | Admitting: Podiatry

## 2024-11-21 ENCOUNTER — Ambulatory Visit: Admitting: Podiatry

## 2024-11-21 DIAGNOSIS — D689 Coagulation defect, unspecified: Secondary | ICD-10-CM | POA: Diagnosis not present

## 2024-11-21 DIAGNOSIS — M79675 Pain in left toe(s): Secondary | ICD-10-CM

## 2024-11-21 DIAGNOSIS — B351 Tinea unguium: Secondary | ICD-10-CM | POA: Diagnosis not present

## 2024-11-21 DIAGNOSIS — M79674 Pain in right toe(s): Secondary | ICD-10-CM

## 2024-11-21 NOTE — Progress Notes (Addendum)
 This patient returns to my office for at risk foot care.  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  Patient is taking eliquis .  This patient is unable to cut nails himself since the patient cannot reach his nails.These nails are painful walking and wearing shoes.  This patient presents for at risk foot care today. Patient says he injured his big toenail right foot.  General Appearance  Alert, conversant and in no acute stress.  Vascular  Dorsalis pedis and posterior tibial  pulses are palpable  bilaterally.  Capillary return is within normal limits  bilaterally. Temperature is within normal limits  bilaterally.  Neurologic  Senn-Weinstein monofilament wire test within normal limits  bilaterally. Muscle power within normal limits bilaterally.  Nails Thick disfigured discolored nails with subungual debris hallux nails  bilaterally. No evidence of bacterial infection or drainage bilaterally.  Orthopedic  No limitations of motion  feet .  No crepitus or effusions noted.  No bony pathology or digital deformities noted.  Skin  normotropic skin with no porokeratosis noted bilaterally.  No signs of infections or ulcers noted.     Onychomycosis  Pain in right toes  Pain in left toes  Consent was obtained for treatment procedures.   Mechanical debridement of nails 1-5  bilaterally performed with a nail nipper.  Filed with dremel without incident.    Return office visit   12 weeks                   Told patient to return for periodic foot care and evaluation due to potential at risk complications.   Cordella Bold DPM/ce

## 2025-02-20 ENCOUNTER — Ambulatory Visit: Admitting: Podiatry
